# Patient Record
Sex: Male | Born: 1955
Health system: Southern US, Community
[De-identification: ages and names within clinical notes are randomized; demographics above are authoritative.]

## PROBLEM LIST (undated history)

## (undated) DIAGNOSIS — C801 Malignant (primary) neoplasm, unspecified: Secondary | ICD-10-CM

## (undated) DIAGNOSIS — I219 Acute myocardial infarction, unspecified: Secondary | ICD-10-CM

## (undated) DIAGNOSIS — M720 Palmar fascial fibromatosis [Dupuytren]: Secondary | ICD-10-CM

## (undated) DIAGNOSIS — E7800 Pure hypercholesterolemia, unspecified: Secondary | ICD-10-CM

## (undated) DIAGNOSIS — Z9889 Other specified postprocedural states: Secondary | ICD-10-CM

## (undated) DIAGNOSIS — I454 Nonspecific intraventricular block: Secondary | ICD-10-CM

## (undated) DIAGNOSIS — N182 Chronic kidney disease, stage 2 (mild): Secondary | ICD-10-CM

## (undated) DIAGNOSIS — I10 Essential (primary) hypertension: Secondary | ICD-10-CM

## (undated) DIAGNOSIS — I2510 Atherosclerotic heart disease of native coronary artery without angina pectoris: Secondary | ICD-10-CM

## (undated) DIAGNOSIS — Z87442 Personal history of urinary calculi: Secondary | ICD-10-CM

## (undated) DIAGNOSIS — F32A Depression, unspecified: Secondary | ICD-10-CM

## (undated) DIAGNOSIS — S62009A Unspecified fracture of navicular [scaphoid] bone of unspecified wrist, initial encounter for closed fracture: Secondary | ICD-10-CM

## (undated) DIAGNOSIS — S2239XA Fracture of one rib, unspecified side, initial encounter for closed fracture: Secondary | ICD-10-CM

## (undated) DIAGNOSIS — R0789 Other chest pain: Secondary | ICD-10-CM

## (undated) DIAGNOSIS — Z8619 Personal history of other infectious and parasitic diseases: Secondary | ICD-10-CM

## (undated) DIAGNOSIS — F329 Major depressive disorder, single episode, unspecified: Secondary | ICD-10-CM

## (undated) DIAGNOSIS — H5350 Unspecified color vision deficiencies: Secondary | ICD-10-CM

## (undated) DIAGNOSIS — F319 Bipolar disorder, unspecified: Secondary | ICD-10-CM

## (undated) HISTORY — DX: Depression, unspecified: F32.A

## (undated) HISTORY — DX: Fracture of one rib, unspecified side, initial encounter for closed fracture: S22.39XA

## (undated) HISTORY — DX: Personal history of urinary calculi: Z87.442

## (undated) HISTORY — PX: KNEE ARTHROSCOPY: SHX127

## (undated) HISTORY — DX: Palmar fascial fibromatosis (dupuytren): M72.0

## (undated) HISTORY — PX: LUMBAR SPINE SURGERY: SHX701

## (undated) HISTORY — DX: Unspecified fracture of navicular (scaphoid) bone of unspecified wrist, initial encounter for closed fracture: S62.009A

## (undated) HISTORY — DX: Personal history of other infectious and parasitic diseases: Z86.19

## (undated) HISTORY — PX: HAND SURGERY: SHX662

## (undated) HISTORY — DX: Unspecified color vision deficiencies: H53.50

## (undated) HISTORY — DX: Bipolar disorder, unspecified: F31.9

## (undated) HISTORY — DX: Pure hypercholesterolemia, unspecified: E78.00

## (undated) HISTORY — DX: Major depressive disorder, single episode, unspecified: F32.9

---

## 1999-03-21 ENCOUNTER — Ambulatory Visit (HOSPITAL_COMMUNITY): Admission: RE | Admit: 1999-03-21 | Discharge: 1999-03-21 | Payer: Self-pay | Admitting: Neurosurgery

## 1999-03-21 ENCOUNTER — Encounter: Payer: Self-pay | Admitting: Neurosurgery

## 1999-11-17 ENCOUNTER — Encounter: Payer: Self-pay | Admitting: Neurosurgery

## 1999-11-17 ENCOUNTER — Ambulatory Visit (HOSPITAL_COMMUNITY): Admission: RE | Admit: 1999-11-17 | Discharge: 1999-11-17 | Payer: Self-pay | Admitting: Neurosurgery

## 2001-11-18 ENCOUNTER — Encounter: Payer: Self-pay | Admitting: Family Medicine

## 2001-11-18 ENCOUNTER — Ambulatory Visit (HOSPITAL_COMMUNITY): Admission: RE | Admit: 2001-11-18 | Discharge: 2001-11-18 | Payer: Self-pay | Admitting: Family Medicine

## 2001-11-28 ENCOUNTER — Observation Stay (HOSPITAL_COMMUNITY): Admission: RE | Admit: 2001-11-28 | Discharge: 2001-11-29 | Payer: Self-pay | Admitting: Neurosurgery

## 2001-11-28 ENCOUNTER — Encounter: Payer: Self-pay | Admitting: Neurosurgery

## 2002-02-08 ENCOUNTER — Emergency Department (HOSPITAL_COMMUNITY): Admission: EM | Admit: 2002-02-08 | Discharge: 2002-02-08 | Payer: Self-pay | Admitting: Emergency Medicine

## 2002-02-08 ENCOUNTER — Encounter: Payer: Self-pay | Admitting: Emergency Medicine

## 2009-08-16 ENCOUNTER — Encounter: Payer: Self-pay | Admitting: Family Medicine

## 2009-08-16 LAB — CONVERTED CEMR LAB
BUN: 6 mg/dL
Cholesterol: 234 mg/dL
Creatinine, Ser: 1.1 mg/dL
Direct LDL: 165 mg/dL
Glucose, Bld: 73 mg/dL
HDL: 38 mg/dL
PSA: 0.75 ng/mL
Total CHOL/HDL Ratio: 6.16
Triglycerides: 163 mg/dL

## 2010-07-25 ENCOUNTER — Encounter: Payer: Self-pay | Admitting: Family Medicine

## 2010-07-25 DIAGNOSIS — Z87442 Personal history of urinary calculi: Secondary | ICD-10-CM | POA: Insufficient documentation

## 2010-07-25 DIAGNOSIS — Z8659 Personal history of other mental and behavioral disorders: Secondary | ICD-10-CM | POA: Insufficient documentation

## 2010-07-25 DIAGNOSIS — E7800 Pure hypercholesterolemia, unspecified: Secondary | ICD-10-CM | POA: Insufficient documentation

## 2010-07-27 ENCOUNTER — Ambulatory Visit (INDEPENDENT_AMBULATORY_CARE_PROVIDER_SITE_OTHER): Payer: BC Managed Care – PPO | Admitting: Family Medicine

## 2010-07-27 ENCOUNTER — Other Ambulatory Visit: Payer: Self-pay | Admitting: Family Medicine

## 2010-07-27 ENCOUNTER — Encounter: Payer: Self-pay | Admitting: Family Medicine

## 2010-07-27 DIAGNOSIS — E7800 Pure hypercholesterolemia, unspecified: Secondary | ICD-10-CM

## 2010-07-27 DIAGNOSIS — E785 Hyperlipidemia, unspecified: Secondary | ICD-10-CM

## 2010-07-27 DIAGNOSIS — F329 Major depressive disorder, single episode, unspecified: Secondary | ICD-10-CM | POA: Insufficient documentation

## 2010-07-27 DIAGNOSIS — M720 Palmar fascial fibromatosis [Dupuytren]: Secondary | ICD-10-CM | POA: Insufficient documentation

## 2010-07-27 DIAGNOSIS — Z0000 Encounter for general adult medical examination without abnormal findings: Secondary | ICD-10-CM

## 2010-07-27 DIAGNOSIS — Z9189 Other specified personal risk factors, not elsewhere classified: Secondary | ICD-10-CM | POA: Insufficient documentation

## 2010-07-27 DIAGNOSIS — Z125 Encounter for screening for malignant neoplasm of prostate: Secondary | ICD-10-CM

## 2010-07-27 LAB — LIPID PANEL
Cholesterol: 272 mg/dL — ABNORMAL HIGH (ref 0–200)
HDL: 37 mg/dL — ABNORMAL LOW (ref >39.00)
Total CHOL/HDL Ratio: 7
Triglycerides: 202 mg/dL — ABNORMAL HIGH (ref 0.0–149.0)
VLDL: 40.4 mg/dL — ABNORMAL HIGH (ref 0.0–40.0)

## 2010-07-27 LAB — HEPATIC FUNCTION PANEL
ALT: 18 U/L (ref 0–53)
AST: 21 U/L (ref 0–37)
Albumin: 4.1 g/dL (ref 3.5–5.2)
Alkaline Phosphatase: 73 U/L (ref 39–117)
Bilirubin, Direct: 0.1 mg/dL (ref 0.0–0.3)
Total Bilirubin: 0.5 mg/dL (ref 0.3–1.2)
Total Protein: 7.1 g/dL (ref 6.0–8.3)

## 2010-07-27 LAB — BASIC METABOLIC PANEL
BUN: 11 mg/dL (ref 6–23)
CO2: 29 mEq/L (ref 19–32)
Calcium: 9.8 mg/dL (ref 8.4–10.5)
Chloride: 104 mEq/L (ref 96–112)
Creatinine, Ser: 1.2 mg/dL (ref 0.4–1.5)
GFR: 64.57 mL/min (ref >60.00)
Glucose, Bld: 85 mg/dL (ref 70–99)
Potassium: 4.9 mEq/L (ref 3.5–5.1)
Sodium: 141 mEq/L (ref 135–145)

## 2010-07-27 LAB — PSA: PSA: 0.63 ng/mL (ref 0.10–4.00)

## 2010-07-27 LAB — LDL CHOLESTEROL, DIRECT: Direct LDL: 187.1 mg/dL

## 2010-07-28 ENCOUNTER — Encounter (INDEPENDENT_AMBULATORY_CARE_PROVIDER_SITE_OTHER): Payer: Self-pay | Admitting: *Deleted

## 2010-08-02 NOTE — Letter (Signed)
Summary: [facility]: Immunoassay Fecal Occult Blood (iFOB) Order Form  [place] at [facility]  [address] [place], [place] [address]   Phone: [phone]  Fax: [phone]      [facility]: Immunoassay Fecal Occult Blood (iFOB) Order Form   July 27, 2010 MRN: [id number]   THAISON KOLODZIEJSKI 06-11-1956   Physicican Name:________duncan_________________  Diagnosis Code:_________v76.49_________________      Crawford Givens MD

## 2010-08-02 NOTE — Letter (Signed)
Summary: Joseph Vaughn at [facility]   Imported By: Maryln Gottron 07/28/2010 14:31:12  _____________________________________________________________________  External Attachment:    Type:   Image     Comment:   External Document

## 2010-08-02 NOTE — Letter (Signed)
Summary: Generic Letter  [place] at [facility]  [address] [place], [place] [address]   Phone: [phone]  Fax: [phone]    07/28/2010    [facility] [address] [place], [place]  [address]  [place]     Dear Joseph Vaughn,   Your PSA is fine, sugar and Liver function tests are fine.  Lipids are up.  I recommend  increasing your exercise, work on low fat diet and recheck in the summer.  Your appointments are enclosed with this mailing.  Please call the office if these appointments are not convenient.  If you have to change your lab appointment, please change your office visit appointment accordingly so that lab results will be resulted to [place] at your visit.  Thank you.       Sincerely,   Joseph Vaughn CMA (AAMA)

## 2010-08-02 NOTE — Assessment & Plan Note (Signed)
Summary: new pt to est cpe [facility]   Vital Signs:  Patient profile:   55 year old male Height:      65 inches Weight:      160.75 pounds BMI:     26.85 Temp:     98.2 degrees F oral Pulse rate:   80 / minute Pulse rhythm:   regular BP sitting:   112 / 72  (left arm) Cuff size:   regular  Vitals Entered By: Delilah Shan CMA Joseph Vaughn) (July 27, 2010 9:53 AM) CC: New Patient to Establish - CPE   History of Present Illness: CPE- fasting.  due for labs.  See plan.   Prev [facility] patient.   BAD.  Seeing Dr. Toni Arthurs with psych.  Mood is much improved on wellbutrin.  Prev with SAD in the winter but much improved this year.    Has follow up with ophtho pending- going on Monday.    Elevated Cholesterol: no meds prev with elevation of lipids due for labs  d/w patient: re exercise, currently minimal but he increases during the summer.   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  Otherwise negative.    Physical Exam  General:  GEN: nad, alert and oriented HEENT: mucous membranes moist NECK: supple w/o LA CV: rrr.  no murmur PULM: ctab, no inc wob ABD: soft, +bs EXT: no edema SKIN: no acute rash  dupuytren's contracture, right 5th finger noted.  Rectal:  No external abnormalities noted. Normal sphincter tone. No rectal masses or tenderness. Prostate:  Prostate gland firm and smooth, no enlargement, nodularity, tenderness, mass, asymmetry or induration.   Impression & Recommendations:  Problem # 1:  Preventive Health Care (ICD-V70.0) d/w patient ZO:XWRU and exercise.  Healthy habits encouraged.  Flu shot encouraged.  d/w patient EA:VWUJW CA screening.  No FH and no blood in stool per patient.  It is reasonable to check IFOB and if pos then proceed with colonoscopy.  Colonoscopy offered and he opted for IFOB.    Problem # 2:  PURE HYPERCHOLESTEROLEMIA (ICD-272.0) check labs and contact patient.  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  Nonbothersome.  r5th finger.   Problem # 4:  Screening PSA (ICD-V76.44) will check given the FH.   DRE wnl.  Complete Medication List: 1)  Lamictal 150 Mg Tabs (Lamotrigine) .... Take 1 tablet by mouth once a day 2)  Wellbutrin Sr 150 Mg Xr12h-tab (Bupropion hcl) .... Take 1 tablet by mouth once a day  Other Orders: TLB-PSA (Prostate Specific Antigen) (84153-PSA)  Patient Instructions: 1)  I would get more exercise. 2)  I would think about getting a flu shot.   3)  You can get your results through our phone system.   Follow the instructions on the blue card.  4)  Glad to see you today.   5)  Take care.    Orders Added: 1)  Est. Patient 40-64 years [99396] 2)  Est. Patient Level III [69629] 3)  TLB-BMP (Basic Metabolic Panel-BMET) [80048-METABOL] 4)  TLB-Hepatic/Liver Function Pnl [80076-HEPATIC] 5)  TLB-Lipid Panel [80061-LIPID] 6)  TLB-PSA (Prostate Specific Antigen) [52841-LKG]    Current Allergies (reviewed today): No known allergies

## 2010-09-08 ENCOUNTER — Other Ambulatory Visit: Payer: BC Managed Care – PPO

## 2010-09-08 DIAGNOSIS — Z1289 Encounter for screening for malignant neoplasm of other sites: Secondary | ICD-10-CM

## 2010-12-22 ENCOUNTER — Other Ambulatory Visit: Payer: Self-pay | Admitting: Family Medicine

## 2010-12-22 DIAGNOSIS — E7800 Pure hypercholesterolemia, unspecified: Secondary | ICD-10-CM

## 2010-12-26 ENCOUNTER — Other Ambulatory Visit (INDEPENDENT_AMBULATORY_CARE_PROVIDER_SITE_OTHER): Payer: BC Managed Care – PPO | Admitting: Family Medicine

## 2010-12-26 ENCOUNTER — Ambulatory Visit: Payer: BC Managed Care – PPO | Admitting: Family Medicine

## 2010-12-26 DIAGNOSIS — E7800 Pure hypercholesterolemia, unspecified: Secondary | ICD-10-CM

## 2010-12-26 LAB — LDL CHOLESTEROL, DIRECT: Direct LDL: 177.4 mg/dL

## 2010-12-26 LAB — LIPID PANEL
Cholesterol: 245 mg/dL — ABNORMAL HIGH (ref 0–200)
HDL: 35.6 mg/dL — ABNORMAL LOW (ref >39.00)
Total CHOL/HDL Ratio: 7
Triglycerides: 197 mg/dL — ABNORMAL HIGH (ref 0.0–149.0)
VLDL: 39.4 mg/dL (ref 0.0–40.0)

## 2011-01-02 ENCOUNTER — Ambulatory Visit: Payer: BC Managed Care – PPO | Admitting: Family Medicine

## 2011-01-02 ENCOUNTER — Encounter: Payer: Self-pay | Admitting: Family Medicine

## 2011-01-03 ENCOUNTER — Ambulatory Visit (INDEPENDENT_AMBULATORY_CARE_PROVIDER_SITE_OTHER): Payer: BC Managed Care – PPO | Admitting: Family Medicine

## 2011-01-03 ENCOUNTER — Ambulatory Visit: Payer: BC Managed Care – PPO | Admitting: Family Medicine

## 2011-01-03 ENCOUNTER — Encounter: Payer: Self-pay | Admitting: Family Medicine

## 2011-01-03 DIAGNOSIS — E7800 Pure hypercholesterolemia, unspecified: Secondary | ICD-10-CM

## 2011-01-03 DIAGNOSIS — Z125 Encounter for screening for malignant neoplasm of prostate: Secondary | ICD-10-CM

## 2011-01-03 DIAGNOSIS — R2230 Localized swelling, mass and lump, unspecified upper limb: Secondary | ICD-10-CM

## 2011-01-03 DIAGNOSIS — H029 Unspecified disorder of eyelid: Secondary | ICD-10-CM

## 2011-01-03 DIAGNOSIS — L570 Actinic keratosis: Secondary | ICD-10-CM

## 2011-01-03 DIAGNOSIS — R229 Localized swelling, mass and lump, unspecified: Secondary | ICD-10-CM

## 2011-01-03 NOTE — Assessment & Plan Note (Signed)
He has some diffuse changes and I'd like derm input on treatment, instead of spot treatment.  Refer.

## 2011-01-03 NOTE — Progress Notes (Signed)
Elevated Cholesterol: No meds.  Labs reviewed with patient.  He made some progress on lipids.  We talked about diet and exercise.  Excess upper eyelid tissue- he asked about options.  He'll call about f/u with eye clinic.   Skin rash- noted recently.  See exam.   Noted change in L axilla in last 24h.   Meds, vitals, and allergies reviewed.   ROS: See HPI.  Otherwise negative.    GEN: nad, alert and oriented HEENT: mucous membranes moist, excess upper eyelid tissue noted NECK: supple w/o LA CV: rrr. PULM: ctab, no inc wob ABD: soft, +bs EXT: no edema SKIN: appears to have diffuse actinic changes on the skin on L>R forearms.  Also with fleshy papule on R cheek of face L axilla with <1cm skin lesion, not ttp and no overlying skin changes.

## 2011-01-03 NOTE — Assessment & Plan Note (Signed)
D/w pt about diet, weight.  Recheck at CPE.  No meds now.

## 2011-01-03 NOTE — Assessment & Plan Note (Signed)
This is small, of recent onset- ~24h, no overlying skin changes. No other LA.  Pt to observe and notify me if it persists or enlarges. He agrees.

## 2011-01-03 NOTE — Assessment & Plan Note (Signed)
Bilateral, pt to self refer.

## 2011-01-03 NOTE — Patient Instructions (Signed)
See Shirlee Limerick about your referral before your leave today. Recheck labs in late 2/13 at fasting lab visit.   CPE a few days after that. Try to work on cutting out cigarettes.  Take care.  Glad to see you.  Call about the eye clinic- let me know if you need help getting the appointment.

## 2011-02-05 ENCOUNTER — Encounter: Payer: Self-pay | Admitting: Family Medicine

## 2011-02-21 ENCOUNTER — Encounter: Payer: Self-pay | Admitting: Family Medicine

## 2011-02-21 DIAGNOSIS — N200 Calculus of kidney: Secondary | ICD-10-CM | POA: Insufficient documentation

## 2011-02-21 HISTORY — DX: Calculus of kidney: N20.0

## 2011-10-18 ENCOUNTER — Telehealth: Payer: Self-pay

## 2011-10-18 MED ORDER — TRAMADOL HCL 50 MG PO TABS: 50.0000 mg | ORAL_TABLET | Freq: Two times a day (BID) | ORAL | Status: AC | PRN

## 2011-10-18 NOTE — Telephone Encounter (Signed)
He can take tramadol bid prn with sedation caution.  If not improved with that and heat, then he needs eval early next week.  Sedation caution on meds.

## 2011-10-18 NOTE — Telephone Encounter (Signed)
Patient advised.

## 2011-10-18 NOTE — Telephone Encounter (Signed)
Last week while sleeping pt thinks pulled muscle at left shoulder;pt does not want appt. Heat helps pain temporarily but pt wants med for pain. Pt said lt shoulder hurts all the time but worse upon movement . 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 Pt last seen 01/03/11 and has cpx scheduled for 12/25/11. Pt uses [facility] [phone] if pharmacy needed/ Pt can be reached at [phone].

## 2011-10-23 ENCOUNTER — Telehealth: Payer: Self-pay | Admitting: Family Medicine

## 2011-10-23 NOTE — Telephone Encounter (Signed)
Caller: Mike/Patient; PCP: Crawford Givens Clelia Croft); CB#: [phone];  Call regarding Neck Pain, onset 05/10, had Tramadol called in by office.  Is calling today due to "the Tramadol is not taking care of the pain with activity."  Is planning an 800 mile motorcycle ride the weekend of 05/18, and would like a stronger med to be called in, or advise if he can take an additional Tylenol or Advil along with it, or even Flexeril (he has Flexeril at home."  RN advised appt for evaluation, he plans to get it checked next week.  Requests to send message to office. PLEASE CALL MIKE AT [phone] AND ADVISE ABOUT THESE ADDITIONAL MEDS, IF RX IS CALLED IN, USES [facility] [address].  THANKS.

## 2011-10-24 NOTE — Telephone Encounter (Signed)
See prev note.  If not improving, then he needs OV and eval.  Thanks.

## 2011-10-24 NOTE — Telephone Encounter (Signed)
Spoke to patient and was advised that he is feeling much better today. Patient stated that if he does not continue to improve he will call for an appointment.

## 2011-12-11 ENCOUNTER — Ambulatory Visit (INDEPENDENT_AMBULATORY_CARE_PROVIDER_SITE_OTHER): Payer: BC Managed Care – PPO | Admitting: Family Medicine

## 2011-12-11 ENCOUNTER — Encounter: Payer: Self-pay | Admitting: Family Medicine

## 2011-12-11 ENCOUNTER — Other Ambulatory Visit: Payer: BC Managed Care – PPO

## 2011-12-11 ENCOUNTER — Ambulatory Visit: Payer: BC Managed Care – PPO | Admitting: Family Medicine

## 2011-12-11 VITALS — BP 120/76 | HR 77 | Temp 98.2°F | Wt 160.0 lb

## 2011-12-11 DIAGNOSIS — M542 Cervicalgia: Secondary | ICD-10-CM | POA: Insufficient documentation

## 2011-12-11 MED ORDER — PREDNISONE 10 MG PO TABS: ORAL_TABLET | ORAL | Status: DC

## 2011-12-11 NOTE — Patient Instructions (Addendum)
Take the prednisone with food and see if that helps.  If not, let me know.  Take care.

## 2011-12-11 NOTE — Progress Notes (Signed)
Neck and shoulder pain. Currently has a 'twinge'.  When "full blown" it radiates down to L shoulder and the L lateral upper arm. He can get hand/forearm tingling.  Flexeril didn't help.  This AM he felt well. Pain started some this AM at work. Pain worst on motorcycle.  Legs and arms are extended widely on his motorcycle.  Sx going on intermittently for 2 months.  Initially it was constant, now intermittent over the last few weeks.   No R sided sx.  No new/related leg sx.   He went to chiropractor and didn't get relief.   Not taking aleve or ibuprofen recently.  Had used occ leftover hydrocodone w/o relief.    Meds, vitals, and allergies reviewed.   ROS: See HPI.  Otherwise, noncontributory.  nad ncat Neck supple but some pain with lateral tilt B (though no midline pain) rrr ctab L shoulder with normal ROM but pain on int>ext rotation, +scap assist.  No arm drop. Distally nv intact. DTRs wnl

## 2011-12-11 NOTE — Assessment & Plan Note (Signed)
Likely radicular component and cuff sx.  Would use prednisone taper for now and call back if not improved, may need PT vs ortho eval.  No indication to image today.  D/w pt.  GI caution on steroids.

## 2011-12-19 ENCOUNTER — Other Ambulatory Visit: Payer: BC Managed Care – PPO

## 2011-12-19 ENCOUNTER — Other Ambulatory Visit (INDEPENDENT_AMBULATORY_CARE_PROVIDER_SITE_OTHER): Payer: BC Managed Care – PPO

## 2011-12-19 DIAGNOSIS — E7800 Pure hypercholesterolemia, unspecified: Secondary | ICD-10-CM

## 2011-12-19 DIAGNOSIS — Z125 Encounter for screening for malignant neoplasm of prostate: Secondary | ICD-10-CM

## 2011-12-19 LAB — PSA: PSA: 0.75 ng/mL (ref 0.10–4.00)

## 2011-12-19 LAB — COMPREHENSIVE METABOLIC PANEL
ALT: 25 U/L (ref 0–53)
AST: 20 U/L (ref 0–37)
Albumin: 3.5 g/dL (ref 3.5–5.2)
Alkaline Phosphatase: 70 U/L (ref 39–117)
BUN: 14 mg/dL (ref 6–23)
CO2: 28 mEq/L (ref 19–32)
Calcium: 8.9 mg/dL (ref 8.4–10.5)
Chloride: 101 mEq/L (ref 96–112)
Creatinine, Ser: 1.2 mg/dL (ref 0.4–1.5)
GFR: 64.84 mL/min (ref >60.00)
Glucose, Bld: 86 mg/dL (ref 70–99)
Potassium: 4.3 mEq/L (ref 3.5–5.1)
Sodium: 138 mEq/L (ref 135–145)
Total Bilirubin: 0.6 mg/dL (ref 0.3–1.2)
Total Protein: 6.3 g/dL (ref 6.0–8.3)

## 2011-12-19 LAB — LIPID PANEL
Cholesterol: 262 mg/dL — ABNORMAL HIGH (ref 0–200)
HDL: 39.7 mg/dL (ref >39.00)
Total CHOL/HDL Ratio: 7
Triglycerides: 218 mg/dL — ABNORMAL HIGH (ref 0.0–149.0)
VLDL: 43.6 mg/dL — ABNORMAL HIGH (ref 0.0–40.0)

## 2011-12-19 LAB — LDL CHOLESTEROL, DIRECT: Direct LDL: 161.7 mg/dL

## 2011-12-25 ENCOUNTER — Encounter: Payer: BC Managed Care – PPO | Admitting: Family Medicine

## 2011-12-27 ENCOUNTER — Encounter: Payer: Self-pay | Admitting: Family Medicine

## 2011-12-27 ENCOUNTER — Ambulatory Visit (INDEPENDENT_AMBULATORY_CARE_PROVIDER_SITE_OTHER): Payer: BC Managed Care – PPO | Admitting: Family Medicine

## 2011-12-27 VITALS — BP 150/90 | HR 82 | Temp 98.4°F | Ht 65.0 in | Wt 161.0 lb

## 2011-12-27 DIAGNOSIS — Z8659 Personal history of other mental and behavioral disorders: Secondary | ICD-10-CM

## 2011-12-27 DIAGNOSIS — M720 Palmar fascial fibromatosis [Dupuytren]: Secondary | ICD-10-CM

## 2011-12-27 DIAGNOSIS — Z1211 Encounter for screening for malignant neoplasm of colon: Secondary | ICD-10-CM

## 2011-12-27 DIAGNOSIS — Z0000 Encounter for general adult medical examination without abnormal findings: Secondary | ICD-10-CM

## 2011-12-27 DIAGNOSIS — M25519 Pain in unspecified shoulder: Secondary | ICD-10-CM

## 2011-12-27 DIAGNOSIS — E7800 Pure hypercholesterolemia, unspecified: Secondary | ICD-10-CM

## 2011-12-27 NOTE — Patient Instructions (Addendum)
Go to the lab on the way out.  We'll contact you with your lab report. See Shirlee Limerick about your referral before you leave today. Take care.  I would get a flu shot each fall.   We'll address your cholesterol after the shoulder/neck symptom are resolved and your hand is treated.  Call back about the hand center referral when desired.  Glad to see you.

## 2011-12-27 NOTE — Progress Notes (Signed)
CPE- See plan.  Routine anticipatory guidance given to patient.  See health maintenance. Tetanus 2009 Labs d/w pt. Smoking- continued.  Precontemplative.  He'll notify me when ready to quit.  PSA wnl.  FH prostate cancer.   Colon cancer screening.  D/w patient ZO:[id number] for colon cancer screening, including IFOB vs. colonoscopy.  Risks and benefits of both were discussed and patient voiced understanding.  Pt elects for: IFOB.  Flu shot.  Encouraged.   BP prev controlled.  Elevation today is isolated.    BAD, continued on meds and he's considering a second opinion.  Mood controlled and stable.    R hand contracture, this was tabled due to neck pain.  L neck and arm pain continues.  No pain with overhead rom prev.  Was thought to haverRadicular component in L arm. Positional.  Minimal effect with steroids prev in 7/13.  He did have cuff finding prev.   PMH and SH reviewed  Meds, vitals, and allergies reviewed.   ROS: See HPI.  Otherwise negative.    GEN: nad, alert and oriented HEENT: mucous membranes moist NECK: supple w/o LA CV: rrr. PULM: ctab, no inc wob ABD: soft, +bs EXT: no edema SKIN: no acute rash DRE deferred.   L shoulder-Pain with int rotation.  No arm drop. Distally nv intact.  Spurling neg.

## 2011-12-28 DIAGNOSIS — Z0000 Encounter for general adult medical examination without abnormal findings: Secondary | ICD-10-CM | POA: Insufficient documentation

## 2011-12-28 DIAGNOSIS — M25519 Pain in unspecified shoulder: Secondary | ICD-10-CM | POA: Insufficient documentation

## 2011-12-28 NOTE — Assessment & Plan Note (Signed)
Routine anticipatory guidance given to patient.  See health maintenance. Tetanus 2009 Labs d/w pt. Smoking- continued.  Precontemplative.  He'll notify me when ready to quit.  PSA wnl.  FH prostate cancer.   Colon cancer screening.  D/w patient [id number] for colon cancer screening, including IFOB vs. colonoscopy.  Risks and benefits of both were discussed and patient voiced understanding.  Pt elects for: IFOB.  Flu shot.  Encouraged.   BP prev controlled.  Elevation today is isolated.

## 2011-12-28 NOTE — Assessment & Plan Note (Signed)
Per psych 

## 2011-12-28 NOTE — Assessment & Plan Note (Signed)
This was tabled in light of neck/shoulder sx.  Continue diet/exercise.

## 2011-12-28 NOTE — Assessment & Plan Note (Signed)
Can be addressed after should/neck sx are addressed.

## 2011-12-28 NOTE — Assessment & Plan Note (Signed)
Unclear if he has shoulder source causing neck pain or if he has 2 separate issues.  I would like ortho input on his shoulder and then proceed from there.  D/w pt.  He agrees.

## 2012-01-06 ENCOUNTER — Encounter: Payer: Self-pay | Admitting: Family Medicine

## 2014-04-27 ENCOUNTER — Ambulatory Visit (INDEPENDENT_AMBULATORY_CARE_PROVIDER_SITE_OTHER): Payer: BC Managed Care – PPO | Admitting: Family Medicine

## 2014-04-27 ENCOUNTER — Encounter: Payer: Self-pay | Admitting: Family Medicine

## 2014-04-27 VITALS — BP 124/78 | HR 64 | Temp 98.3°F | Wt 166.5 lb

## 2014-04-27 DIAGNOSIS — K644 Residual hemorrhoidal skin tags: Secondary | ICD-10-CM

## 2014-04-27 DIAGNOSIS — K648 Other hemorrhoids: Secondary | ICD-10-CM

## 2014-04-27 MED ORDER — HYDROCORTISONE 2.5 % RE CREA: 1.0000 "application " | TOPICAL_CREAM | Freq: Two times a day (BID) | RECTAL | Status: DC

## 2014-04-27 MED ORDER — TRAMADOL HCL 50 MG PO TABS: 50.0000 mg | ORAL_TABLET | Freq: Three times a day (TID) | ORAL | Status: DC | PRN

## 2014-04-27 NOTE — Progress Notes (Signed)
Pre visit review using our clinic review tool, if applicable. No additional management support is needed unless otherwise documented below in the visit note.  Rectal pain.  Patient thought he had hemorrhoids.  Going on for the last few weeks.  Was doing better until the weekend.  Did yardwork, then more pain in the meantime.  Last BM was this AM.  He can't tell if he has blood in stool, colorblind.   No fevers, no vomiting.   Has tried nupercainal w/o relief.    Meds, vitals, and allergies reviewed.   ROS: See HPI.  Otherwise, noncontributory.  nad but uncomfortable Ext rectal exam with small tender but nonthrombosed external hemorrhoid

## 2014-04-27 NOTE — Patient Instructions (Signed)
Take a stool softener (colace100mg  a day).  Take tramadol for pain.   Use anusol and keep trying nupercainal.   Avoid straining.  Sitz baths may help.  Take care.

## 2014-04-28 ENCOUNTER — Telehealth: Payer: Self-pay | Admitting: Family Medicine

## 2014-04-28 DIAGNOSIS — K644 Residual hemorrhoidal skin tags: Secondary | ICD-10-CM | POA: Insufficient documentation

## 2014-04-28 NOTE — Telephone Encounter (Signed)
emmi emailed °

## 2014-04-28 NOTE — Assessment & Plan Note (Signed)
Not a candidate for thrombectomy.  Would use tramadol prn pain, stool softener, topical med- see AVS.  Path/phys d/w pt.  F/u prn. He agrees.

## 2014-06-11 HISTORY — PX: CERVICAL SPINE SURGERY: SHX589

## 2014-12-16 ENCOUNTER — Encounter: Payer: Self-pay | Admitting: Family Medicine

## 2015-12-06 ENCOUNTER — Ambulatory Visit (INDEPENDENT_AMBULATORY_CARE_PROVIDER_SITE_OTHER): Payer: BLUE CROSS/BLUE SHIELD | Admitting: Family Medicine

## 2015-12-06 ENCOUNTER — Encounter: Payer: Self-pay | Admitting: Family Medicine

## 2015-12-06 VITALS — BP 126/76 | HR 66 | Temp 98.4°F | Wt 161.0 lb

## 2015-12-06 DIAGNOSIS — R51 Headache: Secondary | ICD-10-CM

## 2015-12-06 DIAGNOSIS — R519 Headache, unspecified: Secondary | ICD-10-CM

## 2015-12-06 LAB — CBC WITH DIFFERENTIAL/PLATELET
Basophils Absolute: 0.1 10*3/uL (ref 0.0–0.1)
Basophils Relative: 0.9 % (ref 0.0–3.0)
Eosinophils Absolute: 0.3 10*3/uL (ref 0.0–0.7)
Eosinophils Relative: 3 % (ref 0.0–5.0)
HCT: 45.5 % (ref 39.0–52.0)
Hemoglobin: 15.2 g/dL (ref 13.0–17.0)
Lymphocytes Relative: 26.6 % (ref 12.0–46.0)
Lymphs Abs: 2.5 10*3/uL (ref 0.7–4.0)
MCHC: 33.4 g/dL (ref 30.0–36.0)
MCV: 92.4 fl (ref 78.0–100.0)
Monocytes Absolute: 0.9 10*3/uL (ref 0.1–1.0)
Monocytes Relative: 9.5 % (ref 3.0–12.0)
Neutro Abs: 5.6 10*3/uL (ref 1.4–7.7)
Neutrophils Relative %: 60 % (ref 43.0–77.0)
Platelets: 262 10*3/uL (ref 150.0–400.0)
RBC: 4.92 Mil/uL (ref 4.22–5.81)
RDW: 13.3 % (ref 11.5–15.5)
WBC: 9.3 10*3/uL (ref 4.0–10.5)

## 2015-12-06 LAB — COMPREHENSIVE METABOLIC PANEL
ALT: 18 U/L (ref 0–53)
AST: 21 U/L (ref 0–37)
Albumin: 4.2 g/dL (ref 3.5–5.2)
Alkaline Phosphatase: 76 U/L (ref 39–117)
BUN: 9 mg/dL (ref 6–23)
CO2: 33 mEq/L — ABNORMAL HIGH (ref 19–32)
Calcium: 9.8 mg/dL (ref 8.4–10.5)
Chloride: 101 mEq/L (ref 96–112)
Creatinine, Ser: 1.27 mg/dL (ref 0.40–1.50)
GFR: 61.62 mL/min (ref >60.00)
Glucose, Bld: 94 mg/dL (ref 70–99)
Potassium: 4.7 mEq/L (ref 3.5–5.1)
Sodium: 137 mEq/L (ref 135–145)
Total Bilirubin: 0.5 mg/dL (ref 0.2–1.2)
Total Protein: 7.2 g/dL (ref 6.0–8.3)

## 2015-12-06 LAB — SEDIMENTATION RATE: Sed Rate: 5 mm/hr (ref 0–20)

## 2015-12-06 MED ORDER — CYCLOBENZAPRINE HCL 5 MG PO TABS: 5.0000 mg | ORAL_TABLET | Freq: Three times a day (TID) | ORAL | Status: DC | PRN

## 2015-12-06 NOTE — Patient Instructions (Signed)
Try taking flexeril as needed for the headache.  Sedation caution.   Go to the lab on the way out.  We'll contact you with your lab report. I'll check with neurology in the meantime.   Take care.  Glad to see you.

## 2015-12-06 NOTE — Progress Notes (Signed)
Pre visit review using our clinic review tool, if applicable. No additional management support is needed unless otherwise documented below in the visit note.  Headache.  Going on for about 1 month intermittently.  L ear pain.  HA can occ get better.  Resolved yesterday but then came back this AM.  He can get L upper eyelid droop with the HA.  No R sided droop.  No double vision.  No lip droop.  No speech changes. No vision changes o/w.  No arm or leg sx.  1-2 beers causes the HA to cease quickly.  Less caffeine then prev but still a few cokes a day.  HA is throbbing.  No nausea, no vomiting usually, once or twice was slightly nauseated.  No photophobia.    Always on the L side.  Never on R side of face  Prev neck surgery 2016.    H/o migraine in the past but this is still atypical for patient, with the eyelid droop on the L side.   PMH and SH reviewed  ROS: Per HPI unless specifically indicated in ROS section   Meds, vitals, and allergies reviewed.   GEN: nad, alert and oriented HEENT: mucous membranes moist, tm wnl B nasal and OP exam wnl NECK: supple w/o LA CV: rrr PULM: ctab, no inc wob ABD: soft, +bs EXT: no edema SKIN: no acute rash CN 2-12 wnl B, S/S/DTR wnl x4 except for slight L upper eyelid/brow droop (he has HA currently).

## 2015-12-07 ENCOUNTER — Telehealth: Payer: Self-pay | Admitting: Family Medicine

## 2015-12-07 ENCOUNTER — Encounter: Payer: Self-pay | Admitting: Family Medicine

## 2015-12-07 DIAGNOSIS — R519 Headache, unspecified: Secondary | ICD-10-CM | POA: Insufficient documentation

## 2015-12-07 DIAGNOSIS — R51 Headache: Secondary | ICD-10-CM

## 2015-12-07 DIAGNOSIS — G43809 Other migraine, not intractable, without status migrainosus: Secondary | ICD-10-CM

## 2015-12-07 NOTE — Assessment & Plan Note (Addendum)
H/o migraine but this is atypical for him.  Going on for the last month.  Only focal neuro change is L upper eyelid droop with the HA, never present w/o HA.  Not needing emergent sx at this point, given the duration and exam.  I would like to check labs today and d/w neuro in the meantime.  We'll be in touch with patient.  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  .>25 minutes spent in face to face time with patient, >50% spent in counselling or coordination of care.  All d/w pt.  Routine ER cautions d/w pt.    Also d/w pt at the [facility].  He has been having ongoing neck pain, radiating up to his head.  He didn't know if that was related.  He has h/o C spine fusion with Dr. Lynann Bologna.  He doesn't have rash or bruising on the neck.  No focal motor changes in the arms.

## 2015-12-07 NOTE — Telephone Encounter (Signed)
Patient notified as instructed by telephone and verbalized understanding. Advised patient that he will hear back from the referral coordinator to get the MRI set up for him.

## 2015-12-07 NOTE — Telephone Encounter (Signed)
Call pt.  See labs.  Talked to Dr. Tomi Likens.  Would have him see neuro.   Would need MRI with/without prev.   I put in all of the orders.  Thanks.

## 2015-12-15 ENCOUNTER — Telehealth: Payer: Self-pay | Admitting: Family Medicine

## 2015-12-15 DIAGNOSIS — M542 Cervicalgia: Secondary | ICD-10-CM

## 2015-12-15 NOTE — Telephone Encounter (Signed)
Pt called he has mri schedule for 7/11  He wanted to talk to dr Damita Dunnings regarding the [facility]

## 2015-12-16 NOTE — Telephone Encounter (Signed)
Left message with receptionist to ask Dr. Lynann Bologna to return your call.

## 2015-12-16 NOTE — Telephone Encounter (Signed)
Patient advised.  Joseph Vaughn has already scheduled CT.  Patient advised to schedule FU with Dr. Lynann Bologna.

## 2015-12-16 NOTE — Telephone Encounter (Addendum)
Please call Dr. Laurena Bering office (ortho, [phone]).  I either need to talk to him or get a message through. Okay to give my cell number re: call back.  D/w pt.  Likely with migraines, with MRI head pending.   That still needs to be done.  Patient still with sig neck pain and he questions if the neck pain is contributing/triggering the migraines.   Patient questions about concurrently imaging his neck.   Does Dr. Lynann Bologna want this done?  If so, what form of imaging? He is scheduled for MRI brain with and without contrast on Tuesday.  Thanks for help of all.

## 2015-12-16 NOTE — Telephone Encounter (Signed)
Call pt.  I talked with Dr. Lynann Bologna.   Reasonable to check for nerve root impingement (MRI C spine w/o contrast) and malunion from prev surgery (CT C spine w/o contrast). I ordered both.   Then patient patient f/u with Dr. Lynann Bologna.  He'll need to call patient about follow up with Dr. Lynann Bologna.   Thanks.

## 2015-12-20 ENCOUNTER — Ambulatory Visit
Admission: RE | Admit: 2015-12-20 | Discharge: 2015-12-20 | Disposition: A | Discharge status: Home / Self Care | Payer: BLUE CROSS/BLUE SHIELD | Source: Ambulatory Visit | Attending: Family Medicine | Admitting: Family Medicine

## 2015-12-20 ENCOUNTER — Other Ambulatory Visit: Payer: BLUE CROSS/BLUE SHIELD

## 2015-12-20 DIAGNOSIS — G43809 Other migraine, not intractable, without status migrainosus: Secondary | ICD-10-CM

## 2015-12-20 MED ORDER — GADOBENATE DIMEGLUMINE 529 MG/ML IV SOLN
14.0000 mL | Freq: Once | INTRAVENOUS | Status: AC | PRN
  Administered 2015-12-20: 14 mL via INTRAVENOUS

## 2015-12-23 ENCOUNTER — Ambulatory Visit
Admission: RE | Admit: 2015-12-23 | Discharge: 2015-12-23 | Disposition: A | Discharge status: Home / Self Care | Payer: BLUE CROSS/BLUE SHIELD | Source: Ambulatory Visit | Attending: Family Medicine | Admitting: Family Medicine

## 2015-12-23 ENCOUNTER — Other Ambulatory Visit: Payer: BLUE CROSS/BLUE SHIELD

## 2015-12-23 DIAGNOSIS — M542 Cervicalgia: Secondary | ICD-10-CM

## 2016-01-31 ENCOUNTER — Encounter: Payer: Self-pay | Admitting: Family Medicine

## 2016-02-01 ENCOUNTER — Ambulatory Visit (INDEPENDENT_AMBULATORY_CARE_PROVIDER_SITE_OTHER): Payer: BLUE CROSS/BLUE SHIELD | Admitting: Neurology

## 2016-02-01 ENCOUNTER — Encounter: Payer: Self-pay | Admitting: Neurology

## 2016-02-01 VITALS — BP 130/80 | HR 72 | Ht 65.0 in | Wt 167.4 lb

## 2016-02-01 DIAGNOSIS — G4451 Hemicrania continua: Secondary | ICD-10-CM | POA: Insufficient documentation

## 2016-02-01 DIAGNOSIS — H02402 Unspecified ptosis of left eyelid: Secondary | ICD-10-CM
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
## 2016-02-01 NOTE — Progress Notes (Signed)
Opened in error

## 2016-02-01 NOTE — Patient Instructions (Signed)
1.  US carotids  2.  If headaches return, call my office and we can try indomethicin

## 2016-02-08 ENCOUNTER — Other Ambulatory Visit: Payer: Self-pay | Admitting: Neurology

## 2016-02-08 ENCOUNTER — Ambulatory Visit (HOSPITAL_COMMUNITY)
Admission: RE | Admit: 2016-02-08 | Discharge: 2016-02-08 | Disposition: A | Discharge status: Home / Self Care | Payer: BLUE CROSS/BLUE SHIELD | Source: Ambulatory Visit | Attending: Cardiology | Admitting: Cardiology

## 2016-02-08 DIAGNOSIS — G4451 Hemicrania continua: Secondary | ICD-10-CM

## 2016-02-08 DIAGNOSIS — H02402 Unspecified ptosis of left eyelid: Secondary | ICD-10-CM

## 2016-02-08 DIAGNOSIS — R42 Dizziness and giddiness: Secondary | ICD-10-CM | POA: Diagnosis not present

## 2016-02-08 DIAGNOSIS — E785 Hyperlipidemia, unspecified: Secondary | ICD-10-CM | POA: Diagnosis not present

## 2016-02-08 DIAGNOSIS — Z720 Tobacco use: Secondary | ICD-10-CM | POA: Insufficient documentation

## 2016-10-25 ENCOUNTER — Telehealth: Payer: Self-pay | Admitting: *Deleted

## 2016-10-25 NOTE — Telephone Encounter (Signed)
Patient called stating that he had surgery on his neck back 10/2014 and is now having problem with his other shoulder. Patient stated that he has an appointment scheduled with his surgeon Monday. Patient wants to know if you will give him a few pain pills to last him until that appointment?

## 2016-10-26 MED ORDER — CYCLOBENZAPRINE HCL 5 MG PO TABS: 5.0000 mg | ORAL_TABLET | Freq: Three times a day (TID) | ORAL | 0 refills | Status: DC | PRN

## 2016-10-26 NOTE — Telephone Encounter (Signed)
I sent in flexeril in the meantime, will await surgery notes.  Thanks.

## 2016-11-16 ENCOUNTER — Encounter (HOSPITAL_COMMUNITY): Payer: Self-pay

## 2016-11-16 ENCOUNTER — Emergency Department (HOSPITAL_COMMUNITY)
Admission: EM | Admit: 2016-11-16 | Discharge: 2016-11-16 | Disposition: A | Discharge status: Home / Self Care | Payer: Worker's Compensation | Attending: Emergency Medicine | Admitting: Emergency Medicine

## 2016-11-16 ENCOUNTER — Emergency Department (HOSPITAL_COMMUNITY): Payer: Worker's Compensation

## 2016-11-16 DIAGNOSIS — Y939 Activity, unspecified: Secondary | ICD-10-CM | POA: Diagnosis not present

## 2016-11-16 DIAGNOSIS — S63502A Unspecified sprain of left wrist, initial encounter: Secondary | ICD-10-CM | POA: Diagnosis not present

## 2016-11-16 DIAGNOSIS — Y929 Unspecified place or not applicable: Secondary | ICD-10-CM | POA: Insufficient documentation

## 2016-11-16 DIAGNOSIS — W208XXA Other cause of strike by thrown, projected or falling object, initial encounter: Secondary | ICD-10-CM | POA: Diagnosis not present

## 2016-11-16 DIAGNOSIS — F17210 Nicotine dependence, cigarettes, uncomplicated: Secondary | ICD-10-CM | POA: Diagnosis not present

## 2016-11-16 DIAGNOSIS — W19XXXA Unspecified fall, initial encounter: Secondary | ICD-10-CM

## 2016-11-16 DIAGNOSIS — Z79899 Other long term (current) drug therapy: Secondary | ICD-10-CM | POA: Diagnosis not present

## 2016-11-16 DIAGNOSIS — S6992XA Unspecified injury of left wrist, hand and finger(s), initial encounter: Secondary | ICD-10-CM | POA: Diagnosis present

## 2016-11-16 DIAGNOSIS — Y999 Unspecified external cause status: Secondary | ICD-10-CM | POA: Diagnosis not present

## 2016-11-16 DIAGNOSIS — M79642 Pain in left hand: Secondary | ICD-10-CM

## 2016-11-16 NOTE — ED Notes (Signed)
Pt ambulatory and independent at discharge.  Verbalized understanding of discharge instructions 
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
## 2016-11-27 ENCOUNTER — Telehealth: Payer: Self-pay | Admitting: Family Medicine

## 2016-11-27 NOTE — Telephone Encounter (Signed)
Call next week

## 2016-11-27 NOTE — Telephone Encounter (Signed)
Call from anesthesia MD re: patient with LBBB seen on day of surgery.  Will need outpatient f/u.  Please call pt next week (he is in surgery today) for f/u OV here next week for f/u.  Thanks.

## 2016-12-03 DIAGNOSIS — M25532 Pain in left wrist: Secondary | ICD-10-CM | POA: Insufficient documentation

## 2016-12-03 NOTE — Telephone Encounter (Signed)
Left detailed message on voicemail.  

## 2016-12-03 NOTE — Telephone Encounter (Signed)
Patient contacted and says he can't be diagnosed with something like this because he has a [employer] license.  Patient will call for an appointment at a later time.

## 2016-12-04 NOTE — Telephone Encounter (Signed)
Anesthesia has already diagnosed him and he needs f/u.  He is likely way better off getting this addressed.  Thanks.

## 2016-12-04 NOTE — Telephone Encounter (Addendum)
Left detailed message on voicemail.  Patient later scheduled appointment.

## 2016-12-06 ENCOUNTER — Ambulatory Visit (INDEPENDENT_AMBULATORY_CARE_PROVIDER_SITE_OTHER): Payer: BLUE CROSS/BLUE SHIELD | Admitting: Family Medicine

## 2016-12-06 ENCOUNTER — Encounter: Payer: Self-pay | Admitting: Family Medicine

## 2016-12-06 VITALS — BP 130/84 | HR 83 | Temp 97.9°F | Wt 162.5 lb

## 2016-12-06 DIAGNOSIS — I454 Nonspecific intraventricular block: Secondary | ICD-10-CM | POA: Diagnosis not present

## 2016-12-06 LAB — TSH: TSH: 0.67 u[IU]/mL (ref 0.35–4.50)

## 2016-12-06 LAB — COMPREHENSIVE METABOLIC PANEL
ALT: 25 U/L (ref 0–53)
AST: 22 U/L (ref 0–37)
Albumin: 4.3 g/dL (ref 3.5–5.2)
Alkaline Phosphatase: 87 U/L (ref 39–117)
BUN: 10 mg/dL (ref 6–23)
CO2: 30 mEq/L (ref 19–32)
Calcium: 10.1 mg/dL (ref 8.4–10.5)
Chloride: 100 mEq/L (ref 96–112)
Creatinine, Ser: 1.26 mg/dL (ref 0.40–1.50)
GFR: 61.97 mL/min (ref >60.00)
Glucose, Bld: 97 mg/dL (ref 70–99)
Potassium: 4.5 mEq/L (ref 3.5–5.1)
Sodium: 138 mEq/L (ref 135–145)
Total Bilirubin: 0.3 mg/dL (ref 0.2–1.2)
Total Protein: 7.5 g/dL (ref 6.0–8.3)

## 2016-12-06 LAB — CBC WITH DIFFERENTIAL/PLATELET
Basophils Absolute: 0.3 10*3/uL — ABNORMAL HIGH (ref 0.0–0.1)
Basophils Relative: 2.7 % (ref 0.0–3.0)
Eosinophils Absolute: 0.2 10*3/uL (ref 0.0–0.7)
Eosinophils Relative: 1.9 % (ref 0.0–5.0)
HCT: 45.1 % (ref 39.0–52.0)
Hemoglobin: 15.4 g/dL (ref 13.0–17.0)
Lymphocytes Relative: 24.3 % (ref 12.0–46.0)
Lymphs Abs: 2.6 10*3/uL (ref 0.7–4.0)
MCHC: 34.1 g/dL (ref 30.0–36.0)
MCV: 91.3 fl (ref 78.0–100.0)
Monocytes Absolute: 0.9 10*3/uL (ref 0.1–1.0)
Monocytes Relative: 8 % (ref 3.0–12.0)
Neutro Abs: 6.8 10*3/uL (ref 1.4–7.7)
Neutrophils Relative %: 63.1 % (ref 43.0–77.0)
Platelets: 361 10*3/uL (ref 150.0–400.0)
RBC: 4.94 Mil/uL (ref 4.22–5.81)
RDW: 12.6 % (ref 11.5–15.5)
WBC: 10.8 10*3/uL — ABNORMAL HIGH (ref 4.0–10.5)

## 2016-12-06 LAB — LIPID PANEL
Cholesterol: 281 mg/dL — ABNORMAL HIGH (ref 0–200)
HDL: 31.4 mg/dL — ABNORMAL LOW (ref >39.00)
NonHDL: 249.78
Total CHOL/HDL Ratio: 9
Triglycerides: 233 mg/dL — ABNORMAL HIGH (ref 0.0–149.0)
VLDL: 46.6 mg/dL — ABNORMAL HIGH (ref 0.0–40.0)

## 2016-12-06 LAB — LDL CHOLESTEROL, DIRECT: Direct LDL: 190 mg/dL

## 2016-12-06 NOTE — Patient Instructions (Addendum)
Go to the lab on the way out.  We'll contact you with your lab report. Let me check with cardiology in the meantime.  Take care.  Glad to see you.  Update me as needed.

## 2016-12-06 NOTE — Progress Notes (Signed)
L wrist per ortho, I'll defer.  D/w pt.  Splinted.   He has neck brace s/p surgery with f/u pending.  I'll defer, d/w pt.    He had episode in the middle of the night, lasted a few minutes, with severe CP.  This was months ago.  No more sx in the meantime.  No sx like that prior.  He has noted sensation of occ heart fluttering.  No CP o/w.  Not SOB.    Had recent surgery and left bundle branch block noted. He did not know he had this before. EKG from that day reviewed. Repeat EKG done today, discussed with patient, no significant change from previous. Left bundle branch block still present. Chest pain-free currently.  PMH and SH reviewed  ROS: Per HPI unless specifically indicated in ROS section   Meds, vitals, and allergies reviewed.   GEN: nad, alert and oriented HEENT: mucous membranes moist NECK: Neck brace. CV: rrr.  PULM: ctab, no inc wob ABD: soft, +bs EXT: no edema Left wrist splinted.

## 2016-12-07 DIAGNOSIS — I454 Nonspecific intraventricular block: Secondary | ICD-10-CM | POA: Insufficient documentation

## 2016-12-07 NOTE — Assessment & Plan Note (Addendum)
He had an episode months ago where he had severe chest pain that resolved after a few minutes. He did not seek evaluation. He did not think much about it until recently when it was noted that he had an incidental left bundle branch block. Repeat EKG done today with similar findings. Discussed with patient about pathophysiology of her left bundle block. He has no chest pain now. He is already in the neck brace and a left wrist brace. Unclear how much he can tolerate with exercise tolerance test. Check routine labs today. He is chest pain-free. I'll talk to cardiology about his potential workup from this point forward. He agrees with plan. >25 minutes spent in face to face time with patient, >50% spent in counselling or coordination of care.

## 2016-12-10 ENCOUNTER — Other Ambulatory Visit: Payer: Self-pay | Admitting: Family Medicine

## 2016-12-10 DIAGNOSIS — E785 Hyperlipidemia, unspecified: Secondary | ICD-10-CM

## 2016-12-10 MED ORDER — ATORVASTATIN CALCIUM 20 MG PO TABS: 20.0000 mg | ORAL_TABLET | Freq: Every day | ORAL | 3 refills | Status: DC

## 2016-12-11 ENCOUNTER — Telehealth: Payer: Self-pay | Admitting: Family Medicine

## 2016-12-11 DIAGNOSIS — I447 Left bundle-branch block, unspecified: Secondary | ICD-10-CM

## 2016-12-11 NOTE — Telephone Encounter (Signed)
Patient advised.

## 2016-12-11 NOTE — Telephone Encounter (Signed)
Call pt.  I checked on this.  Needs cards eval with the LBBB.  I put in the referral.  Thanks.

## 2016-12-19 DIAGNOSIS — S52502A Unspecified fracture of the lower end of left radius, initial encounter for closed fracture: Secondary | ICD-10-CM | POA: Insufficient documentation
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
## 2017-01-24 ENCOUNTER — Encounter (INDEPENDENT_AMBULATORY_CARE_PROVIDER_SITE_OTHER): Payer: Self-pay

## 2017-01-24 ENCOUNTER — Encounter: Payer: Self-pay | Admitting: Cardiovascular Disease

## 2017-01-24 ENCOUNTER — Ambulatory Visit (INDEPENDENT_AMBULATORY_CARE_PROVIDER_SITE_OTHER): Payer: BLUE CROSS/BLUE SHIELD | Admitting: Cardiovascular Disease

## 2017-01-24 VITALS — BP 130/70 | HR 74 | Ht 65.0 in | Wt 165.1 lb

## 2017-01-24 DIAGNOSIS — I447 Left bundle-branch block, unspecified: Secondary | ICD-10-CM

## 2017-01-24 NOTE — Patient Instructions (Signed)
Medication Instructions:  Your physician recommends that you continue on your current medications as directed. Please refer to the Current Medication list given to you today.   Labwork: NONE ORDERED  Testing/Procedures: 1. Your physician has requested that you have a lexiscan myoview. For further information please visit [email or web address]. Please follow instruction sheet, as given.  2. Your physician has requested that you have an echocardiogram. Echocardiography is a painless test that uses sound waves to create images of your heart. It provides your doctor with information about the size and shape of your heart and how well your heart's chambers and valves are working. This procedure takes approximately one hour. There are no restrictions for this procedure.    Follow-Up: Your physician wants you to follow-up in: [place] Blima Singer will receive a reminder letter in the mail two months in advance. If you don't receive a letter, please call our office to schedule the follow-up appointment.   Any Other Special Instructions Will Be Listed Below (If Applicable).     If you need a refill on your cardiac medications before your next appointment, please call your pharmacy.

## 2017-01-30 ENCOUNTER — Telehealth (HOSPITAL_COMMUNITY): Payer: Self-pay | Admitting: *Deleted

## 2017-01-30 NOTE — Telephone Encounter (Signed)
Patient given detailed instructions per Myocardial Perfusion Study Information Sheet for the test on  01/31/17 Patient notified to arrive 15 minutes early and that it is imperative to arrive on time for appointment to keep from having the test rescheduled.  If you need to cancel or reschedule your appointment, please call the office within 24 hours of your appointment. . Patient verbalized understanding. Kirstie Peri

## 2017-01-31 ENCOUNTER — Ambulatory Visit (HOSPITAL_BASED_OUTPATIENT_CLINIC_OR_DEPARTMENT_OTHER): Payer: BLUE CROSS/BLUE SHIELD

## 2017-01-31 ENCOUNTER — Ambulatory Visit (HOSPITAL_COMMUNITY): Payer: BLUE CROSS/BLUE SHIELD | Attending: Internal Medicine

## 2017-01-31 ENCOUNTER — Other Ambulatory Visit: Payer: Self-pay

## 2017-01-31 DIAGNOSIS — E785 Hyperlipidemia, unspecified: Secondary | ICD-10-CM | POA: Diagnosis not present

## 2017-01-31 DIAGNOSIS — I447 Left bundle-branch block, unspecified: Secondary | ICD-10-CM

## 2017-01-31 DIAGNOSIS — F17210 Nicotine dependence, cigarettes, uncomplicated: Secondary | ICD-10-CM | POA: Insufficient documentation

## 2017-01-31 DIAGNOSIS — I071 Rheumatic tricuspid insufficiency: Secondary | ICD-10-CM | POA: Diagnosis not present

## 2017-01-31 DIAGNOSIS — R079 Chest pain, unspecified: Secondary | ICD-10-CM | POA: Diagnosis not present

## 2017-01-31 DIAGNOSIS — R9439 Abnormal result of other cardiovascular function study: Secondary | ICD-10-CM | POA: Insufficient documentation

## 2017-01-31 LAB — MYOCARDIAL PERFUSION IMAGING
LV dias vol: 85 mL (ref 62–150)
LV sys vol: 39 mL
Peak HR: 90 {beats}/min
RATE: 0.3
Rest HR: 60 {beats}/min
SDS: 1
SRS: 12
SSS: 13
TID: 0.99

## 2017-01-31 MED ORDER — TECHNETIUM TC 99M TETROFOSMIN IV KIT
31.1000 | PACK | Freq: Once | INTRAVENOUS | Status: AC | PRN
  Administered 2017-01-31: 31.1 via INTRAVENOUS
  Filled 2017-01-31: qty 32

## 2017-01-31 MED ORDER — TECHNETIUM TC 99M TETROFOSMIN IV KIT
10.2000 | PACK | Freq: Once | INTRAVENOUS | Status: AC | PRN
  Administered 2017-01-31: 10.2 via INTRAVENOUS
  Filled 2017-01-31: qty 11

## 2017-01-31 MED ORDER — REGADENOSON 0.4 MG/5ML IV SOLN
0.4000 mg | Freq: Once | INTRAVENOUS | Status: AC
Start: 2017-01-31 — End: 2017-01-31
  Administered 2017-01-31: 0.4 mg via INTRAVENOUS

## 2017-02-04 ENCOUNTER — Encounter (HOSPITAL_COMMUNITY): Payer: Self-pay

## 2017-02-04 ENCOUNTER — Other Ambulatory Visit (HOSPITAL_COMMUNITY): Payer: Self-pay

## 2017-02-14 ENCOUNTER — Other Ambulatory Visit: Payer: BLUE CROSS/BLUE SHIELD

## 2017-02-25 ENCOUNTER — Other Ambulatory Visit: Payer: BLUE CROSS/BLUE SHIELD

## 2017-02-25 ENCOUNTER — Ambulatory Visit (INDEPENDENT_AMBULATORY_CARE_PROVIDER_SITE_OTHER): Payer: BLUE CROSS/BLUE SHIELD | Admitting: Family Medicine

## 2017-02-25 ENCOUNTER — Encounter: Payer: Self-pay | Admitting: Gastroenterology

## 2017-02-25 ENCOUNTER — Encounter: Payer: Self-pay | Admitting: Family Medicine

## 2017-02-25 VITALS — BP 150/80 | HR 87 | Temp 97.9°F | Wt 165.8 lb

## 2017-02-25 DIAGNOSIS — Z789 Other specified health status: Secondary | ICD-10-CM | POA: Diagnosis not present

## 2017-02-25 DIAGNOSIS — K4090 Unilateral inguinal hernia, without obstruction or gangrene, not specified as recurrent: Secondary | ICD-10-CM | POA: Diagnosis not present

## 2017-02-25 DIAGNOSIS — E7800 Pure hypercholesterolemia, unspecified: Secondary | ICD-10-CM

## 2017-02-25 DIAGNOSIS — Z1211 Encounter for screening for malignant neoplasm of colon: Secondary | ICD-10-CM

## 2017-02-25 NOTE — Progress Notes (Signed)
He had cards f/u.  D/w pt about neg w/u.  Still smoking. Cessation likely more important than statin , d/w pt.  He clearly couldn't tolerate statin use, even with lower dose, d/w pt.  Added to intolerance list.  D/w pt.    He thought he may have a hernia in the R lower abdomen.  Painful locally.  He cannot feel a lump.  Locally tender.  He has been doing a lot of lifting.  Going on for a few weeks.    No blood in stool known but he is colorblind.  Some occ diarrhea.  No burning with urination.   D/w patient [id number] for colon cancer screening, including IFOB vs. colonoscopy.  Risks and benefits of both were discussed and patient voiced understanding.  Pt elects [id number].    Meds, vitals, and allergies reviewed.   ROS: Per HPI unless specifically indicated in ROS section   GEN: nad, alert and oriented HEENT: mucous membranes moist NECK: supple w/o LA CV: rrr PULM: ctab, no inc wob ABD: soft, +bs EXT: no edema SKIN: no acute rash I feel a hernia/bulge on the R inguinal area.  Locally tender but area is soft and self reduces.

## 2017-02-25 NOTE — Patient Instructions (Addendum)
Joseph Vaughn will call about your referral to the GI clinic.  I think I feel a hernia.  Keep an eye on it and update me if worse.  Relative rest in the meantime.   If severe pain then go to the ER.   Take care.  Glad to see you.  Update me as needed.

## 2017-02-26 DIAGNOSIS — K4090 Unilateral inguinal hernia, without obstruction or gangrene, not specified as recurrent: Secondary | ICD-10-CM | POA: Insufficient documentation

## 2017-02-26 DIAGNOSIS — Z1211 Encounter for screening for malignant neoplasm of colon: Secondary | ICD-10-CM | POA: Insufficient documentation

## 2017-02-26 DIAGNOSIS — Z789 Other specified health status: Secondary | ICD-10-CM | POA: Insufficient documentation

## 2017-02-26 NOTE — Assessment & Plan Note (Signed)
Likely the issue. Discussed with patient about relative rest. Limit straining or lifting. If more pain then we can refer to general surgery. He did not want to consider repair at this point. He is able to put up with the pain so this is reasonable for now. If he has any symptoms of incarceration or strangulation then go to the emergency room. Discussed with patient. He agrees.

## 2017-02-26 NOTE — Assessment & Plan Note (Signed)
D/w pt.  He'll work on smoking cessation for risk factor reduction.

## 2017-02-26 NOTE — Assessment & Plan Note (Signed)
Refer

## 2017-02-28 ENCOUNTER — Other Ambulatory Visit: Payer: BLUE CROSS/BLUE SHIELD

## 2017-03-01 ENCOUNTER — Telehealth: Payer: Self-pay | Admitting: Family Medicine

## 2017-03-01 DIAGNOSIS — K4090 Unilateral inguinal hernia, without obstruction or gangrene, not specified as recurrent: Secondary | ICD-10-CM

## 2017-03-01 NOTE — Telephone Encounter (Signed)
I put in the referral.  Thanks.  

## 2017-03-01 NOTE — Telephone Encounter (Signed)
Patient called and said he would like to be referred to a surgeon for his hernia.  Patient would like to go to [place].  Patient can go anytime.  Patient said the sooner the better.

## 2017-03-15 ENCOUNTER — Encounter: Payer: Self-pay | Admitting: Family Medicine

## 2017-03-18 ENCOUNTER — Encounter: Payer: Self-pay | Admitting: *Deleted

## 2017-04-17 ENCOUNTER — Encounter: Payer: Self-pay | Admitting: Gastroenterology

## 2017-05-13 ENCOUNTER — Telehealth: Payer: Self-pay | Admitting: Podiatry

## 2017-05-13 NOTE — Telephone Encounter (Signed)
Patient called wanting to know if Dr. Milinda Pointer had received his MRI report yet? Per Dr. Milinda Pointer, no he has not received. Patient advised that normally he sends out MRI for overread and it take an additional 10 days for the report.  Patient wanted [place] to know that he has been approved for financial assistance program thru [facility] and that if he required surgery it would not be done by [facility] but would have to be referred to a [facility] provider in order for it to be covered. Advised patient that we would contact him as soon as we get the results in and Dr. Milinda Pointer has reviewed them.

## 2017-05-14 ENCOUNTER — Telehealth: Payer: Self-pay

## 2017-05-14 NOTE — Telephone Encounter (Signed)
It has not showed up in the imaging or in the media yet.  We may have to call to get these results.

## 2017-05-14 NOTE — Telephone Encounter (Signed)
error 

## 2017-05-14 NOTE — Telephone Encounter (Signed)
This is the incorrect patient, please disregard all communications.

## 2018-02-02 DIAGNOSIS — H00014 Hordeolum externum left upper eyelid: Secondary | ICD-10-CM | POA: Diagnosis not present

## 2018-02-24 ENCOUNTER — Ambulatory Visit: Payer: Self-pay | Admitting: Family Medicine

## 2018-02-24 NOTE — Telephone Encounter (Signed)
This is skype that was sent to me from [place] at [facility];  [02/24/2018 2:00 PM]  Valli Glance:   Good afternoon Meklit Cotta- I just spoke to [employer]- He is working out of town- he states he thinks he may have broke a rib sneezing last week. He is not having any breathing problem- but does have pain with deep breathing and certain activities. He requested an appointment tomorrow- I did schedule him with good instructions.   Pt has appt 02/25/18 at 10:45 to see Dr Damita Dunnings.

## 2018-02-24 NOTE — Telephone Encounter (Signed)
Noted. Thanks. Will see tomorrow.  

## 2018-02-24 NOTE — Telephone Encounter (Signed)
Patient is calling because he thinks he may have a rib injury- patient states he had a sneezing fit last week and he injured his side. He still has pain and if he takes a deep breath in or does certain activities he has pain. Offered appointment today- but patient states he is working out of town- he states he will go to [facility] if he should have any changes at all- otherwise he will come to appointment in am.  Reason for Disposition . [1] Can't take a deep breath BUT [2] no respiratory distress  Answer Assessment - Initial Assessment Questions 1. MECHANISM: "How did the injury happen?"     Sneezing fit 2. ONSET: "When did the injury happen?" (Minutes or hours ago)     Last Thrusday 3. LOCATION: "Where on the chest is the injury located?"     Left side of chest- patient has pain from chest to waist line 4. APPEARANCE: "What does the injury look like?"     No apparent injury 5. BLEEDING: "Is there any bleeding now? If so, ask: How long has it been bleeding?"     no 6. SEVERITY: "Any difficulty with breathing?"     Pain with deep breath 7. SIZE: For cuts, bruises, or swelling, ask: "How large is it?" (e.g., inches or centimeters)     No briusing 8. PAIN: "Is there pain?" If so, ask: "How bad is the pain?"   (e.g., Scale 1-10; or mild, moderate, severe)     On left side 9. TETANUS: For any breaks in the skin, ask: "When was the last tetanus booster?"     n/a 10. PREGNANCY: "Is there any chance you are pregnant?" "When was your last menstrual period?"       n/a  Protocols used: CHEST INJURY-A-AH

## 2018-02-25 ENCOUNTER — Ambulatory Visit: Payer: BLUE CROSS/BLUE SHIELD | Admitting: Family Medicine

## 2018-02-25 ENCOUNTER — Encounter: Payer: Self-pay | Admitting: Family Medicine

## 2018-02-25 VITALS — BP 130/80 | HR 70 | Temp 97.7°F | Ht 65.0 in | Wt 175.0 lb

## 2018-02-25 DIAGNOSIS — H00019 Hordeolum externum unspecified eye, unspecified eyelid: Secondary | ICD-10-CM | POA: Diagnosis not present

## 2018-02-25 DIAGNOSIS — R0789 Other chest pain: Secondary | ICD-10-CM

## 2018-02-25 NOTE — Patient Instructions (Addendum)
Use warm compresses on your eyelid in the meantime.   Schedule a physical when possible.   Your lungs are clear.  Likely a chest wall strain.  Take aleve with food if needed, 1-2 per day if needed.   Take care.  Glad to see you.

## 2018-02-25 NOTE — Progress Notes (Signed)
Prev seen at [facility] for L upper eyelid stye.  Better in the meantime.  No vision changes.  D/w pt.  See avs.    He is cutting back on tobacco. Minimal use now.  A few cigs a day or less.    He had a sneezing fit, this was last week.  He felt something with the last sneeze on the L side of the chest, anterior.  He had trouble standing up from the pain.  The pain gradually got a little better but he was still sore after the fact, esp with cough/laugh/sneeze/strain.  This AM he is clearly better compared to yesterday.   Not SOB.  Can take a deep breath today w/o pain.  Minimally ttp locally.   He has been taking aleve prn, d/w pt about GI cautions.   Meds, vitals, and allergies reviewed.   ROS: Per HPI unless specifically indicated in ROS section   nad ncat but L upper eyelid stye noted.  No conjunctival irritation.  Mmm Neck supple, no LA rrr ctab L chest wall ttp anteriorly w/o bruising.   Ext w/o edema Skin well perfused.

## 2018-02-26 DIAGNOSIS — H00019 Hordeolum externum unspecified eye, unspecified eyelid: Secondary | ICD-10-CM | POA: Insufficient documentation

## 2018-02-26 DIAGNOSIS — R0789 Other chest pain: Secondary | ICD-10-CM | POA: Insufficient documentation

## 2018-02-26 NOTE — Assessment & Plan Note (Signed)
His lungs are clear.  Likely a chest wall strain.  Take aleve with food if needed, 1-2 per day if needed.   Routine cautions given.  No imaging needed at this point.  Clinically improved.  He agrees.

## 2018-02-26 NOTE — Assessment & Plan Note (Signed)
Use warm compresses and update me as needed.  He agrees.  See after visit summary.

## 2018-03-21 DIAGNOSIS — L820 Inflamed seborrheic keratosis: Secondary | ICD-10-CM | POA: Diagnosis not present

## 2018-03-21 DIAGNOSIS — L570 Actinic keratosis: Secondary | ICD-10-CM | POA: Diagnosis not present

## 2018-03-21 DIAGNOSIS — L814 Other melanin hyperpigmentation: Secondary | ICD-10-CM | POA: Diagnosis not present

## 2018-03-21 DIAGNOSIS — L821 Other seborrheic keratosis: Secondary | ICD-10-CM | POA: Diagnosis not present

## 2018-03-21 DIAGNOSIS — D229 Melanocytic nevi, unspecified: Secondary | ICD-10-CM | POA: Diagnosis not present

## 2018-05-31 ENCOUNTER — Encounter (HOSPITAL_COMMUNITY): Payer: Self-pay | Admitting: Emergency Medicine

## 2018-05-31 ENCOUNTER — Other Ambulatory Visit: Payer: Self-pay

## 2018-05-31 ENCOUNTER — Emergency Department (HOSPITAL_COMMUNITY): Payer: BLUE CROSS/BLUE SHIELD

## 2018-05-31 ENCOUNTER — Inpatient Hospital Stay (HOSPITAL_COMMUNITY)
Admission: EM | Admit: 2018-05-31 | Discharge: 2018-06-03 | DRG: 247 | Disposition: A | Discharge status: Home / Self Care | Payer: BLUE CROSS/BLUE SHIELD | Attending: Internal Medicine | Admitting: Internal Medicine

## 2018-05-31 DIAGNOSIS — I200 Unstable angina: Secondary | ICD-10-CM | POA: Diagnosis not present

## 2018-05-31 DIAGNOSIS — E785 Hyperlipidemia, unspecified: Secondary | ICD-10-CM | POA: Diagnosis not present

## 2018-05-31 DIAGNOSIS — I25119 Atherosclerotic heart disease of native coronary artery with unspecified angina pectoris: Secondary | ICD-10-CM | POA: Diagnosis present

## 2018-05-31 DIAGNOSIS — Z955 Presence of coronary angioplasty implant and graft: Secondary | ICD-10-CM

## 2018-05-31 DIAGNOSIS — I2510 Atherosclerotic heart disease of native coronary artery without angina pectoris: Secondary | ICD-10-CM | POA: Diagnosis not present

## 2018-05-31 DIAGNOSIS — H5350 Unspecified color vision deficiencies: Secondary | ICD-10-CM | POA: Diagnosis not present

## 2018-05-31 DIAGNOSIS — I454 Nonspecific intraventricular block: Secondary | ICD-10-CM | POA: Diagnosis not present

## 2018-05-31 DIAGNOSIS — Z79899 Other long term (current) drug therapy: Secondary | ICD-10-CM | POA: Diagnosis not present

## 2018-05-31 DIAGNOSIS — I25110 Atherosclerotic heart disease of native coronary artery with unstable angina pectoris: Secondary | ICD-10-CM | POA: Diagnosis not present

## 2018-05-31 DIAGNOSIS — R7989 Other specified abnormal findings of blood chemistry: Secondary | ICD-10-CM | POA: Diagnosis present

## 2018-05-31 DIAGNOSIS — R079 Chest pain, unspecified: Secondary | ICD-10-CM | POA: Diagnosis present

## 2018-05-31 DIAGNOSIS — I214 Non-ST elevation (NSTEMI) myocardial infarction: Principal | ICD-10-CM | POA: Diagnosis present

## 2018-05-31 DIAGNOSIS — Z789 Other specified health status: Secondary | ICD-10-CM | POA: Diagnosis present

## 2018-05-31 DIAGNOSIS — Z87442 Personal history of urinary calculi: Secondary | ICD-10-CM | POA: Diagnosis not present

## 2018-05-31 DIAGNOSIS — I361 Nonrheumatic tricuspid (valve) insufficiency: Secondary | ICD-10-CM | POA: Diagnosis not present

## 2018-05-31 DIAGNOSIS — R0789 Other chest pain: Secondary | ICD-10-CM

## 2018-05-31 DIAGNOSIS — E7800 Pure hypercholesterolemia, unspecified: Secondary | ICD-10-CM | POA: Insufficient documentation

## 2018-05-31 DIAGNOSIS — I447 Left bundle-branch block, unspecified: Secondary | ICD-10-CM | POA: Diagnosis not present

## 2018-05-31 DIAGNOSIS — I129 Hypertensive chronic kidney disease with stage 1 through stage 4 chronic kidney disease, or unspecified chronic kidney disease: Secondary | ICD-10-CM | POA: Diagnosis not present

## 2018-05-31 DIAGNOSIS — I370 Nonrheumatic pulmonary valve stenosis: Secondary | ICD-10-CM | POA: Diagnosis not present

## 2018-05-31 DIAGNOSIS — Z888 Allergy status to other drugs, medicaments and biological substances status: Secondary | ICD-10-CM | POA: Diagnosis not present

## 2018-05-31 DIAGNOSIS — F3160 Bipolar disorder, current episode mixed, unspecified: Secondary | ICD-10-CM | POA: Diagnosis not present

## 2018-05-31 DIAGNOSIS — I10 Essential (primary) hypertension: Secondary | ICD-10-CM | POA: Diagnosis not present

## 2018-05-31 DIAGNOSIS — N179 Acute kidney failure, unspecified: Secondary | ICD-10-CM | POA: Diagnosis present

## 2018-05-31 DIAGNOSIS — N182 Chronic kidney disease, stage 2 (mild): Secondary | ICD-10-CM

## 2018-05-31 DIAGNOSIS — N183 Chronic kidney disease, stage 3 (moderate): Secondary | ICD-10-CM | POA: Diagnosis not present

## 2018-05-31 DIAGNOSIS — E782 Mixed hyperlipidemia: Secondary | ICD-10-CM | POA: Diagnosis not present

## 2018-05-31 DIAGNOSIS — F319 Bipolar disorder, unspecified: Secondary | ICD-10-CM | POA: Diagnosis not present

## 2018-05-31 DIAGNOSIS — R072 Precordial pain: Secondary | ICD-10-CM | POA: Diagnosis not present

## 2018-05-31 DIAGNOSIS — F17210 Nicotine dependence, cigarettes, uncomplicated: Secondary | ICD-10-CM | POA: Diagnosis not present

## 2018-05-31 DIAGNOSIS — Z8619 Personal history of other infectious and parasitic diseases: Secondary | ICD-10-CM

## 2018-05-31 HISTORY — DX: Atherosclerotic heart disease of native coronary artery without angina pectoris: I25.10

## 2018-05-31 HISTORY — DX: Personal history of urinary calculi: Z87.442

## 2018-05-31 LAB — COMPREHENSIVE METABOLIC PANEL
ALT: 24 U/L (ref 0–44)
AST: 24 U/L (ref 15–41)
Albumin: 3.8 g/dL (ref 3.5–5.0)
Alkaline Phosphatase: 73 U/L (ref 38–126)
Anion gap: 7 (ref 5–15)
BUN: 12 mg/dL (ref 8–23)
CO2: 28 mmol/L (ref 22–32)
Calcium: 9 mg/dL (ref 8.9–10.3)
Chloride: 104 mmol/L (ref 98–111)
Creatinine, Ser: 1.45 mg/dL — ABNORMAL HIGH (ref 0.61–1.24)
GFR calc Af Amer: 60 mL/min — ABNORMAL LOW (ref >60)
GFR calc non Af Amer: 52 mL/min — ABNORMAL LOW (ref >60)
Glucose, Bld: 112 mg/dL — ABNORMAL HIGH (ref 70–99)
Potassium: 3.9 mmol/L (ref 3.5–5.1)
Sodium: 139 mmol/L (ref 135–145)
Total Bilirubin: 0.4 mg/dL (ref 0.3–1.2)
Total Protein: 7 g/dL (ref 6.5–8.1)

## 2018-05-31 LAB — I-STAT CHEM 8, ED
BUN: 11 mg/dL (ref 8–23)
Calcium, Ion: 1.15 mmol/L (ref 1.15–1.40)
Chloride: 102 mmol/L (ref 98–111)
Creatinine, Ser: 1.5 mg/dL — ABNORMAL HIGH (ref 0.61–1.24)
Glucose, Bld: 107 mg/dL — ABNORMAL HIGH (ref 70–99)
HCT: 45 % (ref 39.0–52.0)
Hemoglobin: 15.3 g/dL (ref 13.0–17.0)
Potassium: 4 mmol/L (ref 3.5–5.1)
Sodium: 139 mmol/L (ref 135–145)
TCO2: 30 mmol/L (ref 22–32)

## 2018-05-31 LAB — CBC WITH DIFFERENTIAL/PLATELET
Abs Immature Granulocytes: 0.03 10*3/uL (ref 0.00–0.07)
Basophils Absolute: 0.1 10*3/uL (ref 0.0–0.1)
Basophils Relative: 1 %
Eosinophils Absolute: 0.3 10*3/uL (ref 0.0–0.5)
Eosinophils Relative: 3 %
HCT: 45.3 % (ref 39.0–52.0)
Hemoglobin: 14.9 g/dL (ref 13.0–17.0)
Immature Granulocytes: 0 %
Lymphocytes Relative: 24 %
Lymphs Abs: 2.4 10*3/uL (ref 0.7–4.0)
MCH: 31 pg (ref 26.0–34.0)
MCHC: 32.9 g/dL (ref 30.0–36.0)
MCV: 94.2 fL (ref 80.0–100.0)
Monocytes Absolute: 0.9 10*3/uL (ref 0.1–1.0)
Monocytes Relative: 9 %
Neutro Abs: 6.1 10*3/uL (ref 1.7–7.7)
Neutrophils Relative %: 63 %
Platelets: 216 10*3/uL (ref 150–400)
RBC: 4.81 MIL/uL (ref 4.22–5.81)
RDW: 12.3 % (ref 11.5–15.5)
WBC: 9.7 10*3/uL (ref 4.0–10.5)
nRBC: 0 % (ref 0.0–0.2)

## 2018-05-31 LAB — I-STAT TROPONIN, ED: Troponin i, poc: 0 ng/mL (ref 0.00–0.08)

## 2018-05-31 LAB — TROPONIN I
Troponin I: <0.03 ng/mL (ref <0.03)
Troponin I: 0.23 ng/mL (ref <0.03)

## 2018-05-31 MED ORDER — BUPROPION HCL ER (SR) 150 MG PO TB12
150.0000 mg | ORAL_TABLET | Freq: Every day | ORAL | Status: DC
  Administered 2018-06-01 – 2018-06-03 (×3): 150 mg via ORAL
  Filled 2018-05-31 (×5): qty 1

## 2018-05-31 MED ORDER — MORPHINE SULFATE (PF) 2 MG/ML IV SOLN
2.0000 mg | INTRAVENOUS | Status: DC | PRN
  Administered 2018-05-31: 2 mg via INTRAVENOUS
  Filled 2018-05-31: qty 1

## 2018-05-31 MED ORDER — MORPHINE SULFATE (PF) 2 MG/ML IV SOLN
2.0000 mg | Freq: Once | INTRAVENOUS | Status: AC
  Administered 2018-05-31: 2 mg via INTRAVENOUS
  Filled 2018-05-31: qty 1

## 2018-05-31 MED ORDER — ONDANSETRON HCL 4 MG/2ML IJ SOLN: 4.0000 mg | Freq: Four times a day (QID) | INTRAMUSCULAR | Status: DC | PRN

## 2018-05-31 MED ORDER — FAMOTIDINE 20 MG PO TABS
20.0000 mg | ORAL_TABLET | Freq: Once | ORAL | Status: AC
  Administered 2018-05-31: 20 mg via ORAL
  Filled 2018-05-31: qty 1

## 2018-05-31 MED ORDER — LAMOTRIGINE 150 MG PO TABS
150.0000 mg | ORAL_TABLET | Freq: Every day | ORAL | Status: DC
  Administered 2018-06-01 – 2018-06-03 (×3): 150 mg via ORAL
  Filled 2018-05-31 (×2): qty 1
  Filled 2018-05-31: qty 6

## 2018-05-31 MED ORDER — ACETAMINOPHEN 325 MG PO TABS: 650.0000 mg | ORAL_TABLET | ORAL | Status: DC | PRN

## 2018-05-31 MED ORDER — LIDOCAINE VISCOUS HCL 2 % MT SOLN
15.0000 mL | Freq: Once | OROMUCOSAL | Status: AC
  Administered 2018-05-31: 15 mL via ORAL
  Filled 2018-05-31: qty 15

## 2018-05-31 MED ORDER — ASPIRIN 325 MG PO TABS
325.0000 mg | ORAL_TABLET | Freq: Once | ORAL | Status: AC
  Administered 2018-05-31: 325 mg via ORAL
  Filled 2018-05-31: qty 1

## 2018-05-31 MED ORDER — NITROGLYCERIN 0.4 MG SL SUBL
0.4000 mg | SUBLINGUAL_TABLET | SUBLINGUAL | Status: DC | PRN
Start: 2018-05-31 — End: 2018-06-03

## 2018-05-31 MED ORDER — ASPIRIN EC 325 MG PO TBEC: 325.0000 mg | DELAYED_RELEASE_TABLET | Freq: Every day | ORAL | Status: DC

## 2018-05-31 MED ORDER — ALUM & MAG HYDROXIDE-SIMETH 200-200-20 MG/5ML PO SUSP
30.0000 mL | Freq: Once | ORAL | Status: AC
  Administered 2018-05-31: 30 mL via ORAL
  Filled 2018-05-31: qty 30

## 2018-05-31 NOTE — ED Notes (Signed)
Date and time results received: 05/31/18 10:31 PM  Test: troponin Critical Value: 0.23  Name of Provider Notified: Dr. Shanon Brow  Orders Received? Or Actions Taken? See orders
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
## 2018-05-31 NOTE — ED Notes (Signed)
Patient refused hospital bed.

## 2018-05-31 NOTE — ED Triage Notes (Signed)
CP in center of chest that radiates up neck, down through shoulders and both arms.

## 2018-05-31 NOTE — ED Notes (Signed)
Contacted [facility] for a hospital bed.
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
## 2018-06-01 ENCOUNTER — Inpatient Hospital Stay (HOSPITAL_COMMUNITY): Payer: BLUE CROSS/BLUE SHIELD

## 2018-06-01 DIAGNOSIS — Z79899 Other long term (current) drug therapy: Secondary | ICD-10-CM | POA: Diagnosis not present

## 2018-06-01 DIAGNOSIS — E782 Mixed hyperlipidemia: Secondary | ICD-10-CM

## 2018-06-01 DIAGNOSIS — Z789 Other specified health status: Secondary | ICD-10-CM | POA: Diagnosis not present

## 2018-06-01 DIAGNOSIS — I447 Left bundle-branch block, unspecified: Secondary | ICD-10-CM | POA: Diagnosis not present

## 2018-06-01 DIAGNOSIS — R072 Precordial pain: Secondary | ICD-10-CM | POA: Diagnosis not present

## 2018-06-01 DIAGNOSIS — F3160 Bipolar disorder, current episode mixed, unspecified: Secondary | ICD-10-CM

## 2018-06-01 DIAGNOSIS — N179 Acute kidney failure, unspecified: Secondary | ICD-10-CM | POA: Diagnosis not present

## 2018-06-01 DIAGNOSIS — I370 Nonrheumatic pulmonary valve stenosis: Secondary | ICD-10-CM

## 2018-06-01 DIAGNOSIS — I25119 Atherosclerotic heart disease of native coronary artery with unspecified angina pectoris: Secondary | ICD-10-CM | POA: Diagnosis not present

## 2018-06-01 DIAGNOSIS — N182 Chronic kidney disease, stage 2 (mild): Secondary | ICD-10-CM

## 2018-06-01 DIAGNOSIS — I361 Nonrheumatic tricuspid (valve) insufficiency: Secondary | ICD-10-CM

## 2018-06-01 DIAGNOSIS — I454 Nonspecific intraventricular block: Secondary | ICD-10-CM | POA: Diagnosis not present

## 2018-06-01 DIAGNOSIS — I2510 Atherosclerotic heart disease of native coronary artery without angina pectoris: Secondary | ICD-10-CM | POA: Diagnosis not present

## 2018-06-01 DIAGNOSIS — N183 Chronic kidney disease, stage 3 (moderate): Secondary | ICD-10-CM | POA: Diagnosis not present

## 2018-06-01 DIAGNOSIS — I10 Essential (primary) hypertension: Secondary | ICD-10-CM | POA: Diagnosis not present

## 2018-06-01 DIAGNOSIS — Z8619 Personal history of other infectious and parasitic diseases: Secondary | ICD-10-CM | POA: Diagnosis not present

## 2018-06-01 DIAGNOSIS — I214 Non-ST elevation (NSTEMI) myocardial infarction: Secondary | ICD-10-CM | POA: Diagnosis not present

## 2018-06-01 DIAGNOSIS — R7989 Other specified abnormal findings of blood chemistry: Secondary | ICD-10-CM | POA: Diagnosis not present

## 2018-06-01 DIAGNOSIS — Z87442 Personal history of urinary calculi: Secondary | ICD-10-CM | POA: Diagnosis not present

## 2018-06-01 DIAGNOSIS — F319 Bipolar disorder, unspecified: Secondary | ICD-10-CM | POA: Diagnosis not present

## 2018-06-01 DIAGNOSIS — E785 Hyperlipidemia, unspecified: Secondary | ICD-10-CM | POA: Diagnosis not present

## 2018-06-01 DIAGNOSIS — I25110 Atherosclerotic heart disease of native coronary artery with unstable angina pectoris: Secondary | ICD-10-CM | POA: Diagnosis not present

## 2018-06-01 DIAGNOSIS — F17210 Nicotine dependence, cigarettes, uncomplicated: Secondary | ICD-10-CM | POA: Diagnosis not present

## 2018-06-01 DIAGNOSIS — I129 Hypertensive chronic kidney disease with stage 1 through stage 4 chronic kidney disease, or unspecified chronic kidney disease: Secondary | ICD-10-CM | POA: Diagnosis not present

## 2018-06-01 DIAGNOSIS — E7800 Pure hypercholesterolemia, unspecified: Secondary | ICD-10-CM | POA: Diagnosis not present

## 2018-06-01 DIAGNOSIS — I200 Unstable angina: Secondary | ICD-10-CM | POA: Diagnosis not present

## 2018-06-01 DIAGNOSIS — Z888 Allergy status to other drugs, medicaments and biological substances status: Secondary | ICD-10-CM | POA: Diagnosis not present

## 2018-06-01 DIAGNOSIS — H5350 Unspecified color vision deficiencies: Secondary | ICD-10-CM | POA: Diagnosis not present

## 2018-06-01 HISTORY — DX: Non-ST elevation (NSTEMI) myocardial infarction: I21.4

## 2018-06-01 LAB — TROPONIN I
Troponin I: 0.35 ng/mL (ref <0.03)
Troponin I: 0.55 ng/mL (ref <0.03)

## 2018-06-01 LAB — ECHOCARDIOGRAM COMPLETE
Height: 65 in
Weight: 2737.6 oz

## 2018-06-01 LAB — HEMOGLOBIN A1C
Hgb A1c MFr Bld: 5.1 % (ref 4.8–5.6)
Mean Plasma Glucose: 99.67 mg/dL

## 2018-06-01 LAB — APTT: aPTT: 29 seconds (ref 24–36)

## 2018-06-01 LAB — PROTIME-INR
INR: 1.01
Prothrombin Time: 13.2 seconds (ref 11.4–15.2)

## 2018-06-01 LAB — HEPARIN LEVEL (UNFRACTIONATED): Heparin Unfractionated: 0.3 IU/mL (ref 0.30–0.70)

## 2018-06-01 MED ORDER — SODIUM CHLORIDE 0.9 % WEIGHT BASED INFUSION
3.0000 mL/kg/h | INTRAVENOUS | Status: DC
  Administered 2018-06-02: 3 mL/kg/h via INTRAVENOUS

## 2018-06-01 MED ORDER — HEPARIN (PORCINE) 25000 UT/250ML-% IV SOLN
1000.0000 [IU]/h | INTRAVENOUS | Status: DC
  Administered 2018-06-01: 950 [IU]/h via INTRAVENOUS
  Administered 2018-06-01: 1000 [IU]/h via INTRAVENOUS
  Filled 2018-06-01 (×2): qty 250

## 2018-06-01 MED ORDER — SODIUM CHLORIDE 0.9 % IV SOLN: 250.0000 mL | INTRAVENOUS | Status: DC | PRN

## 2018-06-01 MED ORDER — ROSUVASTATIN CALCIUM 20 MG PO TABS
40.0000 mg | ORAL_TABLET | Freq: Every day | ORAL | Status: DC
  Administered 2018-06-01 – 2018-06-02 (×2): 40 mg via ORAL
  Filled 2018-06-01 (×2): qty 2

## 2018-06-01 MED ORDER — SODIUM CHLORIDE 0.9% FLUSH: 3.0000 mL | INTRAVENOUS | Status: DC | PRN

## 2018-06-01 MED ORDER — HEPARIN BOLUS VIA INFUSION
4000.0000 [IU] | Freq: Once | INTRAVENOUS | Status: AC
  Administered 2018-06-01: 4000 [IU] via INTRAVENOUS

## 2018-06-01 MED ORDER — ASPIRIN 81 MG PO CHEW
81.0000 mg | CHEWABLE_TABLET | ORAL | Status: AC
  Administered 2018-06-02: 81 mg via ORAL
  Filled 2018-06-01: qty 1

## 2018-06-01 MED ORDER — METOPROLOL SUCCINATE ER 25 MG PO TB24
25.0000 mg | ORAL_TABLET | Freq: Every day | ORAL | Status: DC
  Administered 2018-06-01 – 2018-06-03 (×3): 25 mg via ORAL
  Filled 2018-06-01 (×4): qty 1

## 2018-06-01 MED ORDER — SODIUM CHLORIDE 0.9% FLUSH
3.0000 mL | Freq: Two times a day (BID) | INTRAVENOUS | Status: DC
  Administered 2018-06-01: 3 mL via INTRAVENOUS

## 2018-06-01 MED ORDER — SODIUM CHLORIDE 0.9 % WEIGHT BASED INFUSION
1.0000 mL/kg/h | INTRAVENOUS | Status: DC
  Administered 2018-06-02: 1 mL/kg/h via INTRAVENOUS

## 2018-06-01 MED ORDER — ASPIRIN EC 81 MG PO TBEC
81.0000 mg | DELAYED_RELEASE_TABLET | Freq: Every day | ORAL | Status: DC
  Administered 2018-06-01 – 2018-06-03 (×2): 81 mg via ORAL
  Filled 2018-06-01 (×2): qty 1

## 2018-06-01 NOTE — Progress Notes (Signed)
Patient is a 55-year male with past medical history of left bundle branch block, tobacco abuse, hyperlipidemia, hypertension, bipolar disorder, depression ,Tobacco use who was [facility] for further evaluation of his pain.  There was concern of non-STEMI.  Cardiology consulted.  Started on heparin drip. Also found to have elevated troponins.  Echocardiogram has been ordered. Patient has already been seen by cardiology.  Plan for left heart catheterization in the a.m.  He will be kept n.p.o. after midnight.  Continue aspirin, metoprolol, high-dose statin.  Will check fasting lipid panel and hemoglobin A1c. Patient seen and examined the bedside.  Currently is chest pain-free.  Denies any other specific complaints.  Patient was seen by Dr. Carles Collet  this morning.

## 2018-06-01 NOTE — ED Notes (Signed)
Date and time results received: 06/01/18 0059 (use smartphrase ".now" to insert current time)  Test: troponin Critical Value: 0.35  Name of Provider Notified: Dr Shanon Brow  Orders Received? Or Actions Taken?: see [facility] for interventions

## 2018-06-01 NOTE — ED Notes (Signed)
Report given to [facility] with [place].

## 2018-06-01 NOTE — ED Notes (Addendum)
Joseph Vaughn with lab notified for need of blood draw. Joseph Vaughn at bedside for lab draw.

## 2018-06-01 NOTE — ED Notes (Signed)
Pt given crackers, peanut butter, and sprite per request and per diet ordered by physician.

## 2018-06-01 NOTE — ED Notes (Signed)
[place], NP called this nurse back and reported that he spoke with DR Shanon Brow who explained that echo will be performed tomorrow regardless which campus location. NP was also made aware of troponin levels trending up.

## 2018-06-01 NOTE — ED Notes (Signed)
PT left with [facility] at this time.  

## 2018-06-01 NOTE — ED Notes (Signed)
[facility] called will send a truck to transport pt.
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
## 2018-06-01 NOTE — ED Notes (Addendum)
Spoke with Dr Shanon Brow regarding last troponin level value and trend upwards. Also informed that I had not received call back from [place], NP after paging regarding echo for in the morning.

## 2018-06-01 NOTE — ED Notes (Addendum)
Spoke with Dr Shanon Brow regarding echo ordered to be performed in morning. Xray reported that echo's are not done on weekend. Dr Shanon Brow reported "floor coverage until 1 am" via amion - Therapist, sports) was sent message/paged by this nurse via [facility] stating patient in ED had order for echo to be performed tomorrow morning. Have not received response at this time.

## 2018-06-01 NOTE — Progress Notes (Signed)
[place] for Heparin Indication: chest pain/ACS  Allergies  Allergen Reactions  . Lipitor [Atorvastatin] Other (See Comments)    "Felt awful" on med    Patient Measurements: Height: 5\' 5"  (165.1 cm) Weight: 171 lb 1.6 oz (77.6 kg) IBW/kg (Calculated) : 61.5 kg HEPARIN DW (KG): 77.1 kg   Vital Signs: Temp: 97.8 F (36.6 C) (12/22 0958) Temp Source: Oral (12/22 0958) BP: 147/82 (12/22 0958) Pulse Rate: 62 (12/22 0958)  Labs: Recent Labs    05/31/18 1648 05/31/18 1652  05/31/18 2148 06/01/18 0028 06/01/18 0159 06/01/18 1030  HGB 14.9 15.3  --   --   --   --   --   HCT 45.3 45.0  --   --   --   --   --   PLT 216  --   --   --   --   --   --   APTT  --   --   --   --   --  29  --   LABPROT  --   --   --   --   --  13.2  --   INR  --   --   --   --   --  1.01  --   HEPARINUNFRC  --   --   --   --   --   --  0.30  CREATININE 1.45* 1.50*  --   --   --   --   --   TROPONINI  --   --    < > 0.23* 0.35* 0.55*  --    < > = values in this interval not displayed.   Estimated Creatinine Clearance: 49.7 mL/min (A) (by C-G formula based on SCr of 1.5 mg/dL (H)).   Assessment: 62 yo male on heparin for NSTEMI. No AC PTA. Heparin level 0.3 (low end of therapeutic range) on gtt at 950 units/hr. Baseline CBC wnl. No bleeding noted. Plan for cath 12/23.  Goal of Therapy:  Heparin level 0.3-0.7 units/ml   Plan:  Increase heparin to 1000 units/hr to keep in therapeutic range Check heparin level and CBC daily  Sherlon Handing, PharmD, BCPS Clinical pharmacist  **Pharmacist phone directory can now be found on [email or web address] (PW [email or web address]).  Listed under [place]. 06/01/2018,11:43 AM

## 2018-06-01 NOTE — Progress Notes (Signed)
PROGRESS NOTE  Joseph Vaughn:[id number] DOB: [birth year] DOA: 05/31/2018 PCP: Tonia Ghent, MD  Brief History:  62 year old male with a history of hypertension hyperlipidemia, bipolar disorder presenting with substernal chest discomfort that began around 4 PM on 05/31/2018 when he was sitting in his recliner watching television.  He described discomfort as a squeezing sensation with pressure radiating up to his neck.  It was moderate in nature with somewhat exacerbation with exertion.  He had denied any nausea, dizziness, or diaphoresis.  He stated the episode lasted about 1 hour.  Patient states that he had similar episode approximately 3 weeks ago after he woke up from bed in the morning lasting approximately 45 minutes.  The patient states that belching may have made it somewhat better.  However, he also notes that he has had some exertional dyspnea with activity over the past 2 to 3 weeks.  Unfortunately, the patient continues to smoke approximately 1/2 pack/day.  He had a  NM stress test on 01/31/2017 which showed a fixed apical inferior perfusion defect which was suspected to be due to attenuation rather than ischemia.  Upon presentation the emergency department, the patient was pain-free.  His initial blood pressure was 182/82.  His blood pressure did improve with morphine.  In addition, the patient had received metoprolol succinate in the emergency department.  Although his initial troponin was negative, his troponins have gradually risen to 0.55.  The patient was started on IV heparin.  As result, the patient is being transferred to [facility] for cardiology evaluation.  Assessment/Plan: Chest pain/elevated troponin -The patient is typical and atypical components -Elevated troponins concerning for NSTEMI -Continue IV heparin -Echocardiogram -Transfer to [facility] for cardiology consultation -Continue metoprolol succinate -Personally reviewed chest x-ray--no infiltrates  or pulmonary edema -Check hemoglobin A1c and lipids  Hyperlipidemia -The patient had intolerance to statins -Recheck lipids  Essential hypertension -Patient is not on any agents in the outpatient setting -Continue metoprolol succinate  Bipolar disorder -Continue Wellbutrin and Lamictal  CKD stage II -Baseline creatinine 1.2-1.4  Left bundle branch block -This is chronic -Personally reviewed EKG--sinus rhythm, LBBB  Right upper lobe nodules -will need nonurgent CT scan of the chest and outpatient surveillance -Incidental finding on chest x-ray  Tobacco abuse -I have discussed tobacco cessation with the patient.  I have counseled the patient regarding the negative impacts of continued tobacco use including but not limited to lung cancer, COPD, and cardiovascular disease.  I have discussed alternatives to tobacco and modalities that may help facilitate tobacco cessation including but not limited to biofeedback, hypnosis, and medications.  Total time spent with tobacco counseling was 4 minutes.      Disposition Plan:   Transfer to [facility] Family Communication:  No Family at bedside  Consultants:  Cardiology--Dr. Shanon Brow spoke with cards  Code Status:  FULL   DVT Prophylaxis:  IV Heparin   Procedures: As Listed in Progress Note Above  Antibiotics: None      Subjective: Patient denies fevers, chills, headache, chest pain, dyspnea, nausea, vomiting, diarrhea, abdominal pain, dysuria, hematuria, hematochezia, and melena.   Objective: Vitals:   06/01/18 0441 06/01/18 0530 06/01/18 0600 06/01/18 0630  BP: 139/79 125/63 125/70 131/73  Pulse: 63 65 69 62  Resp:  14 13 15   Temp:      TempSrc:      SpO2:  96% 95% 96%  Weight:      Height:  No intake or output data in the 24 hours ending 06/01/18 0722 Weight change:  Exam:   General:  Pt is alert, follows commands appropriately, not in acute distress  HEENT: No icterus, No thrush, No neck mass,  [place]/AT  Cardiovascular: RRR, S1/S2, no rubs, no gallops  Respiratory: CTA bilaterally, no wheezing, no crackles, no rhonchi  Abdomen: Soft/+BS, non tender, non distended, no guarding  Extremities: No edema, No lymphangitis, No petechiae, No rashes, no synovitis   Data Reviewed: I have personally reviewed following labs and imaging studies Basic Metabolic Panel: Recent Labs  Lab 05/31/18 1648 05/31/18 1652  NA 139 139  K 3.9 4.0  CL 104 102  CO2 28  --   GLUCOSE 112* 107*  BUN 12 11  CREATININE 1.45* 1.50*  CALCIUM 9.0  --    Liver Function Tests: Recent Labs  Lab 05/31/18 1648  AST 24  ALT 24  ALKPHOS 73  BILITOT 0.4  PROT 7.0  ALBUMIN 3.8   No results for input(s): LIPASE, AMYLASE in the last 168 hours. No results for input(s): AMMONIA in the last 168 hours. Coagulation Profile: Recent Labs  Lab 06/01/18 0159  INR 1.01   CBC: Recent Labs  Lab 05/31/18 1648 05/31/18 1652  WBC 9.7  --   NEUTROABS 6.1  --   HGB 14.9 15.3  HCT 45.3 45.0  MCV 94.2  --   PLT 216  --    Cardiac Enzymes: Recent Labs  Lab 05/31/18 1844 05/31/18 2148 06/01/18 0028 06/01/18 0159  TROPONINI <0.03 0.23* 0.35* 0.55*   BNP: Invalid input(s): POCBNP CBG: No results for input(s): GLUCAP in the last 168 hours. HbA1C: No results for input(s): HGBA1C in the last 72 hours. Urine analysis: No results found for: COLORURINE, APPEARANCEUR, LABSPEC, PHURINE, GLUCOSEU, HGBUR, BILIRUBINUR, KETONESUR, PROTEINUR, UROBILINOGEN, NITRITE, LEUKOCYTESUR Sepsis Labs: @LABRCNTIP (procalcitonin:4,lacticidven:4) )No results found for this or any previous visit (from the past 240 hour(s)).   Scheduled Meds: . aspirin EC  325 mg Oral Daily  . buPROPion  150 mg Oral Daily  . lamoTRIgine  150 mg Oral Daily  . metoprolol succinate  25 mg Oral Daily   Continuous Infusions: . heparin 950 Units/hr (06/01/18 0223)    Procedures/Studies: Dg Chest 2 View  Result Date: 05/31/2018 CLINICAL  DATA:  Chest pain EXAM: CHEST - 2 VIEW COMPARISON:  None. FINDINGS: The lungs are clear without focal pneumonia, edema, pneumothorax or pleural effusion. Tiny nodules identified right upper lobe. The cardiopericardial silhouette is within normal limits for size. The visualized bony structures of the thorax are intact. Telemetry leads overlie the chest. IMPRESSION: Tiny right upper lobe pulmonary nodules, likely granulomata. CT chest without contrast could be used to confirm. Otherwise unremarkable. Electronically Signed   By: Misty Stanley M.D.   On: 05/31/2018 17:21    Orson Eva, DO  [facility] Pager [phone]  If 7PM-7AM, please contact night-coverage [email or web address] Password [email or web address] 06/01/2018, 7:22 AM   LOS: 0 days
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
## 2018-06-01 NOTE — Progress Notes (Signed)
06/01/2018 0950 Received pt to room 4E-03 from [facility].  Pt is A&O, no C/O voiced at this time.  Tele monitor applied and CCMD notified.  CHG bath given.  Oriented pt to room, call light and bed.  Call bell in reach. [place]
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
## 2018-06-01 NOTE — ED Notes (Signed)
Dr Shanon Brow has informed me that pt will be admitted transferred to [facility]- pt was updated on his plan of care.

## 2018-06-01 NOTE — ED Notes (Signed)
Date and time results received: 06/01/18 2:34 AM (use smartphrase ".now" to insert current time)  Test: troponin Critical Value: 0.55  Name of Provider Notified: Dr Shanon Brow  Orders Received? Or Actions Taken?: see EMR for details and any new interventions

## 2018-06-01 NOTE — Progress Notes (Signed)
*  PRELIMINARY RESULTS* Echocardiogram 2D Echocardiogram has been performed.  Samuel Germany 06/01/2018, 1:49 PM

## 2018-06-01 NOTE — ED Notes (Signed)
Pt up to bathroom and back to room- steady gait noted.

## 2018-06-01 NOTE — ED Notes (Signed)
[place], NP called back and spoke with this nurse regarding pt needing echo in the morning per Dr Shanon Brow. NP was unsure if pt was supposed to have echo performed here or elsewhere. Explained that this nurse was unsure about day time procedures as I work night shift, but did explain I had called xray to find out and they reported that cardiology staff were not here on weekends. NP reports he will call Dr Shanon Brow to discuss plan of care and will call me back.

## 2018-06-01 NOTE — ED Notes (Addendum)
Have not received call back after paging Shanon Brow - this nurse asked Nelwyn Salisbury, ED sec to page Dr Shanon Brow again.

## 2018-06-01 NOTE — ED Notes (Signed)
Report given to Juliann Pulse, RN for room [phone]

## 2018-06-02 ENCOUNTER — Encounter (HOSPITAL_COMMUNITY)
Admission: EM | Disposition: A | Discharge status: Home / Self Care | Payer: Self-pay | Source: Home / Self Care | Attending: Internal Medicine

## 2018-06-02 ENCOUNTER — Other Ambulatory Visit: Payer: Self-pay

## 2018-06-02 ENCOUNTER — Encounter (HOSPITAL_COMMUNITY): Payer: Self-pay | Admitting: Cardiovascular Disease

## 2018-06-02 DIAGNOSIS — I10 Essential (primary) hypertension: Secondary | ICD-10-CM

## 2018-06-02 DIAGNOSIS — Z789 Other specified health status: Secondary | ICD-10-CM

## 2018-06-02 DIAGNOSIS — I454 Nonspecific intraventricular block: Secondary | ICD-10-CM

## 2018-06-02 DIAGNOSIS — I25110 Atherosclerotic heart disease of native coronary artery with unstable angina pectoris: Secondary | ICD-10-CM

## 2018-06-02 DIAGNOSIS — E7800 Pure hypercholesterolemia, unspecified: Secondary | ICD-10-CM

## 2018-06-02 HISTORY — PX: INTRAVASCULAR PRESSURE WIRE/FFR STUDY: CATH118243

## 2018-06-02 HISTORY — PX: CORONARY STENT INTERVENTION: CATH118234

## 2018-06-02 HISTORY — PX: LEFT HEART CATH AND CORONARY ANGIOGRAPHY: CATH118249

## 2018-06-02 LAB — CBC
HCT: 46.4 % (ref 39.0–52.0)
Hemoglobin: 15.5 g/dL (ref 13.0–17.0)
MCH: 31.2 pg (ref 26.0–34.0)
MCHC: 33.4 g/dL (ref 30.0–36.0)
MCV: 93.4 fL (ref 80.0–100.0)
Platelets: 238 10*3/uL (ref 150–400)
RBC: 4.97 MIL/uL (ref 4.22–5.81)
RDW: 12.2 % (ref 11.5–15.5)
WBC: 10.2 10*3/uL (ref 4.0–10.5)
nRBC: 0 % (ref 0.0–0.2)

## 2018-06-02 LAB — BASIC METABOLIC PANEL
Anion gap: 13 (ref 5–15)
BUN: 14 mg/dL (ref 8–23)
CO2: 25 mmol/L (ref 22–32)
Calcium: 9.1 mg/dL (ref 8.9–10.3)
Chloride: 102 mmol/L (ref 98–111)
Creatinine, Ser: 1.36 mg/dL — ABNORMAL HIGH (ref 0.61–1.24)
GFR calc Af Amer: >60 mL/min (ref >60)
GFR calc non Af Amer: 56 mL/min — ABNORMAL LOW (ref >60)
Glucose, Bld: 98 mg/dL (ref 70–99)
Potassium: 4.2 mmol/L (ref 3.5–5.1)
Sodium: 140 mmol/L (ref 135–145)

## 2018-06-02 LAB — LIPID PANEL
Cholesterol: 249 mg/dL — ABNORMAL HIGH (ref 0–200)
HDL: 30 mg/dL — ABNORMAL LOW (ref >40)
LDL Cholesterol: 166 mg/dL — ABNORMAL HIGH (ref 0–99)
Total CHOL/HDL Ratio: 8.3 RATIO
Triglycerides: 263 mg/dL — ABNORMAL HIGH (ref <150)
VLDL: 53 mg/dL — ABNORMAL HIGH (ref 0–40)

## 2018-06-02 LAB — POCT ACTIVATED CLOTTING TIME: Activated Clotting Time: 455 seconds

## 2018-06-02 LAB — HEPARIN LEVEL (UNFRACTIONATED): Heparin Unfractionated: 0.43 IU/mL (ref 0.30–0.70)

## 2018-06-02 SURGERY — LEFT HEART CATH AND CORONARY ANGIOGRAPHY
Anesthesia: LOCAL

## 2018-06-02 MED ORDER — NITROGLYCERIN 1 MG/10 ML FOR IR/CATH LAB
INTRA_ARTERIAL | Status: AC
  Filled 2018-06-02: qty 10

## 2018-06-02 MED ORDER — LIDOCAINE HCL (PF) 1 % IJ SOLN
INTRAMUSCULAR | Status: DC | PRN
  Administered 2018-06-02: 2 mL

## 2018-06-02 MED ORDER — ASPIRIN 81 MG PO CHEW: 81.0000 mg | CHEWABLE_TABLET | Freq: Every day | ORAL | Status: DC

## 2018-06-02 MED ORDER — ANGIOPLASTY BOOK
Freq: Once | Status: AC
  Administered 2018-06-03: 1
  Filled 2018-06-02: qty 1

## 2018-06-02 MED ORDER — LABETALOL HCL 5 MG/ML IV SOLN: 10.0000 mg | INTRAVENOUS | Status: AC | PRN

## 2018-06-02 MED ORDER — LIDOCAINE HCL (PF) 1 % IJ SOLN
INTRAMUSCULAR | Status: AC
  Filled 2018-06-02: qty 30

## 2018-06-02 MED ORDER — BIVALIRUDIN BOLUS VIA INFUSION - CUPID
INTRAVENOUS | Status: DC | PRN
  Administered 2018-06-02: 58.2 mg via INTRAVENOUS

## 2018-06-02 MED ORDER — HEPARIN (PORCINE) 25000 UT/250ML-% IV SOLN
1000.0000 [IU]/h | INTRAVENOUS | Status: DC
  Administered 2018-06-02 – 2018-06-03 (×2): 1000 [IU]/h via INTRAVENOUS
  Filled 2018-06-02 (×2): qty 250

## 2018-06-02 MED ORDER — ONDANSETRON HCL 4 MG/2ML IJ SOLN: 4.0000 mg | Freq: Four times a day (QID) | INTRAMUSCULAR | Status: DC | PRN

## 2018-06-02 MED ORDER — HEPARIN (PORCINE) IN NACL 1000-0.9 UT/500ML-% IV SOLN
INTRAVENOUS | Status: AC
  Filled 2018-06-02: qty 500

## 2018-06-02 MED ORDER — HEPARIN (PORCINE) IN NACL 1000-0.9 UT/500ML-% IV SOLN
INTRAVENOUS | Status: DC | PRN
  Administered 2018-06-02: 500 mL

## 2018-06-02 MED ORDER — THE SENSUOUS HEART BOOK
Freq: Once | Status: AC
  Administered 2018-06-03: 05:00:00 1
  Filled 2018-06-02: qty 1

## 2018-06-02 MED ORDER — SODIUM CHLORIDE 0.9 % IV SOLN
INTRAVENOUS | Status: AC
  Administered 2018-06-02: 13:00:00 via INTRAVENOUS

## 2018-06-02 MED ORDER — BIVALIRUDIN TRIFLUOROACETATE 250 MG IV SOLR
INTRAVENOUS | Status: AC
  Filled 2018-06-02: qty 250

## 2018-06-02 MED ORDER — ADENOSINE (DIAGNOSTIC) 140MCG/KG/MIN
INTRAVENOUS | Status: DC | PRN
  Administered 2018-06-02: 140 ug/kg/min via INTRAVENOUS

## 2018-06-02 MED ORDER — SODIUM CHLORIDE 0.9 % IV SOLN
1.7500 mg/kg/h | INTRAVENOUS | Status: AC
  Administered 2018-06-02: 1.75 mg/kg/h via INTRAVENOUS
  Filled 2018-06-02: qty 250

## 2018-06-02 MED ORDER — IOHEXOL 350 MG/ML SOLN
INTRAVENOUS | Status: DC | PRN
  Administered 2018-06-02: 115 mL via INTRA_ARTERIAL

## 2018-06-02 MED ORDER — SODIUM CHLORIDE 0.9% FLUSH: 3.0000 mL | INTRAVENOUS | Status: DC | PRN

## 2018-06-02 MED ORDER — MORPHINE SULFATE (PF) 2 MG/ML IV SOLN
2.0000 mg | INTRAVENOUS | Status: DC | PRN
  Administered 2018-06-02 (×2): 2 mg via INTRAVENOUS
  Filled 2018-06-02 (×2): qty 1

## 2018-06-02 MED ORDER — SODIUM CHLORIDE 0.9 % IV SOLN
INTRAVENOUS | Status: AC | PRN
  Administered 2018-06-02 (×2): 1.75 mg/kg/h via INTRAVENOUS

## 2018-06-02 MED ORDER — NITROGLYCERIN 1 MG/10 ML FOR IR/CATH LAB
INTRA_ARTERIAL | Status: DC | PRN
  Administered 2018-06-02: 200 ug via INTRACORONARY

## 2018-06-02 MED ORDER — ADENOSINE 12 MG/4ML IV SOLN
INTRAVENOUS | Status: AC
  Filled 2018-06-02: qty 16

## 2018-06-02 MED ORDER — MIDAZOLAM HCL 2 MG/2ML IJ SOLN
INTRAMUSCULAR | Status: AC
  Filled 2018-06-02: qty 2

## 2018-06-02 MED ORDER — HEPARIN SODIUM (PORCINE) 1000 UNIT/ML IJ SOLN
INTRAMUSCULAR | Status: DC | PRN
  Administered 2018-06-02: 4000 [IU] via INTRAVENOUS

## 2018-06-02 MED ORDER — VERAPAMIL HCL 2.5 MG/ML IV SOLN
INTRAVENOUS | Status: AC
  Filled 2018-06-02: qty 2

## 2018-06-02 MED ORDER — VERAPAMIL HCL 2.5 MG/ML IV SOLN
INTRAVENOUS | Status: DC | PRN
  Administered 2018-06-02: 10 mL via INTRA_ARTERIAL

## 2018-06-02 MED ORDER — SODIUM CHLORIDE 0.9 % IV SOLN: 250.0000 mL | INTRAVENOUS | Status: DC | PRN

## 2018-06-02 MED ORDER — FENTANYL CITRATE (PF) 100 MCG/2ML IJ SOLN
INTRAMUSCULAR | Status: DC | PRN
  Administered 2018-06-02: 25 ug via INTRAVENOUS

## 2018-06-02 MED ORDER — TICAGRELOR 90 MG PO TABS
ORAL_TABLET | ORAL | Status: AC
  Filled 2018-06-02: qty 2

## 2018-06-02 MED ORDER — FENTANYL CITRATE (PF) 100 MCG/2ML IJ SOLN
INTRAMUSCULAR | Status: AC
  Filled 2018-06-02: qty 2

## 2018-06-02 MED ORDER — TICAGRELOR 90 MG PO TABS
90.0000 mg | ORAL_TABLET | Freq: Two times a day (BID) | ORAL | Status: DC
  Administered 2018-06-02 – 2018-06-03 (×2): 90 mg via ORAL
  Filled 2018-06-02 (×2): qty 1

## 2018-06-02 MED ORDER — SODIUM CHLORIDE 0.9% FLUSH
3.0000 mL | Freq: Two times a day (BID) | INTRAVENOUS | Status: DC
  Administered 2018-06-02: 13:00:00 3 mL via INTRAVENOUS

## 2018-06-02 MED ORDER — MIDAZOLAM HCL 2 MG/2ML IJ SOLN
INTRAMUSCULAR | Status: DC | PRN
  Administered 2018-06-02 (×3): 1 mg via INTRAVENOUS

## 2018-06-02 MED ORDER — HEART ATTACK BOUNCING BOOK
Freq: Once | Status: AC
  Administered 2018-06-03: 1
  Filled 2018-06-02: qty 1

## 2018-06-02 MED ORDER — ACETAMINOPHEN 325 MG PO TABS: 650.0000 mg | ORAL_TABLET | ORAL | Status: DC | PRN

## 2018-06-02 MED ORDER — HYDRALAZINE HCL 20 MG/ML IJ SOLN: 5.0000 mg | INTRAMUSCULAR | Status: AC | PRN

## 2018-06-02 MED ORDER — HEPARIN SODIUM (PORCINE) 1000 UNIT/ML IJ SOLN
INTRAMUSCULAR | Status: AC
  Filled 2018-06-02: qty 1

## 2018-06-02 MED ORDER — TICAGRELOR 90 MG PO TABS
ORAL_TABLET | ORAL | Status: DC | PRN
  Administered 2018-06-02: 180 mg via ORAL

## 2018-06-02 SURGICAL SUPPLY — 21 items
BALLN EMERGE MR 2.0X12 (BALLOONS) ×2
BALLN SAPPHIRE ~~LOC~~ 2.25X15 (BALLOONS) ×2 IMPLANT
BALLOON EMERGE MR 2.0X12 (BALLOONS) ×1 IMPLANT
CATH INFINITI 5 FR JL3.5 (CATHETERS) ×2 IMPLANT
CATH MICROCATH NAVVUS (MICROCATHETER) ×1 IMPLANT
CATH OPTITORQUE TIG 4.0 5F (CATHETERS) ×2 IMPLANT
CATH VISTA GUIDE 6FR XBLAD3.5 (CATHETERS) ×2 IMPLANT
DEVICE RAD COMP TR BAND LRG (VASCULAR PRODUCTS) ×2 IMPLANT
GLIDESHEATH SLEND A-KIT 6F 22G (SHEATH) ×2 IMPLANT
GUIDEWIRE INQWIRE 1.5J.035X260 (WIRE) ×1 IMPLANT
INQWIRE 1.5J .035X260CM (WIRE) ×2
KIT ENCORE 26 ADVANTAGE (KITS) ×2 IMPLANT
KIT ESSENTIALS PG (KITS) ×2 IMPLANT
KIT HEART LEFT (KITS) ×2 IMPLANT
MICROCATHETER NAVVUS (MICROCATHETER) ×2
PACK CARDIAC CATHETERIZATION (CUSTOM PROCEDURE TRAY) ×2 IMPLANT
STENT RESOLUTE ONYX 2.0X18 (Permanent Stent) ×2 IMPLANT
TRANSDUCER W/STOPCOCK (MISCELLANEOUS) ×2 IMPLANT
TUBING CIL FLEX 10 FLL-RA (TUBING) ×2 IMPLANT
WIRE ASAHI PROWATER 180CM (WIRE) ×2 IMPLANT
WIRE HI TORQ VERSACORE-J 145CM (WIRE) ×2 IMPLANT

## 2018-06-02 NOTE — Interval H&P Note (Signed)
Cath Lab Visit (complete for each Cath Lab visit)  Clinical Evaluation Leading to the Procedure:   ACS: Yes.    Non-ACS:    Anginal Classification: CCS III  Anti-ischemic medical therapy: No Therapy  Non-Invasive Test Results: No non-invasive testing performed  Prior CABG: No previous CABG      History and Physical Interval Note:  06/02/2018 9:29 AM  Joseph Vaughn  has presented today for surgery, with the diagnosis of NSTEMI  The various methods of treatment have been discussed with the patient and family. After consideration of risks, benefits and other options for treatment, the patient has consented to  Procedure(s): LEFT HEART CATH AND CORONARY ANGIOGRAPHY (N/A) as a surgical intervention .  The patient's history has been reviewed, patient examined, no change in status, stable for surgery.  I have reviewed the patient's chart and labs.  Questions were answered to the patient's satisfaction.     Quay Burow

## 2018-06-02 NOTE — Progress Notes (Signed)
[facility] for Heparin Indication: chest pain/ACS  Allergies  Allergen Reactions  . Lipitor [Atorvastatin] Other (See Comments)    "Felt awful" on med    Patient Measurements: Height: 5\' 5"  (165.1 cm) Weight: 171 lb 1.6 oz (77.6 kg) IBW/kg (Calculated) : 61.5 kg HEPARIN DW (KG): 77.1 kg   Vital Signs: Temp: 98.5 F (36.9 C) (12/23 1123) Temp Source: Oral (12/23 1123) BP: 130/75 (12/23 1123) Pulse Rate: 64 (12/23 1123)  Labs: Recent Labs    05/31/18 1648 05/31/18 1652  05/31/18 2148 06/01/18 0028 06/01/18 0159 06/01/18 1030 06/02/18 0323  HGB 14.9 15.3  --   --   --   --   --  15.5  HCT 45.3 45.0  --   --   --   --   --  46.4  PLT 216  --   --   --   --   --   --  238  APTT  --   --   --   --   --  29  --   --   LABPROT  --   --   --   --   --  13.2  --   --   INR  --   --   --   --   --  1.01  --   --   HEPARINUNFRC  --   --   --   --   --   --  0.30 0.43  CREATININE 1.45* 1.50*  --   --   --   --   --  1.36*  TROPONINI  --   --    < > 0.23* 0.35* 0.55*  --   --    < > = values in this interval not displayed.   Estimated Creatinine Clearance: 54.8 mL/min (A) (by C-G formula based on SCr of 1.36 mg/dL (H)).   Assessment: 61 yo male on heparin for NSTEMI. No AC PTA. Heparin level 0.3 (low end of therapeutic range) on gtt at 950 units/hr. Baseline CBC wnl. No bleeding noted.   Heparin level at goal this morning, cbc stable overnight.   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 Successful PCI to LAD, patient still needs staged PCI to Cfx. Orders received to continue full dose bival for 4 hours then transition back to heparin infusion.    Goal of Therapy:  Heparin level 0.3-0.7 units/ml   Plan:  Restart heparin 1000 units/hr at 1530 this afternoon Check heparin level and CBC daily  Erin Hearing PharmD., BCPS Clinical Pharmacist 06/02/2018 11:42 AM  **Pharmacist phone directory can now be found on [email or web address] (PW  [email or web address]).  Listed under [place]. 06/02/2018,11:40 AM

## 2018-06-02 NOTE — Progress Notes (Addendum)
Patient c/o  TRB on wrist causing  pain  & discomfort , site level 0, radial pulse2+, color WNL, warm to touch. Kept elevated on pillow , offered morphine for pain ,patient refused stated " it doesn't help" , offered ice pack but  Refused.Explained air  deflation to start 2 hrs post infussion of angiomax, patient upset,  Insisted site is tight , 3 cc air deflated, no bleeding noted , Bhagat PA notified , will come up and evaluate , patient made aware.site monitored , kept elevated on pillow.  Seen by  Dr Gwenlyn Found ,radial site  evaluated ,  patient agreed to get pain med at this time ,another  3 cc air deflated, no bleeding noted. Radial site remain level 0

## 2018-06-02 NOTE — Care Management (Signed)
#  6.   S/W ROBERT   @ [facility] [employer] # [phone]   BRILINTA  90 MG BID COVER- YES CO-PAY-  $ 367.32 TIER- NONE FORMULARY PRIOR APPROVAL- NO  DEDUCTIBLE : NOT MET  PREFERRED PHARMACY : YES   - [facility]

## 2018-06-02 NOTE — Care Management Note (Addendum)
Case Management Note  Patient Details  Name: KIYAN BURMESTER MRN: [id number] Date of Birth: 20-Dec-1955  Subjective/Objective:    From home with wife, s/p stent intervention, will be on brilinta, NCM gave patient the 30 day free card and the 5 dollar each month card.  The 5.00 each month card will go towards his deductilbe 200.00 and once the deductible is met he can use the 5.00 card.  Informed wife that Cardiologist will have samples in the office and if patient finds medication is still too high to let cardiologist know to switch to something else.               Action/Plan: DC home when ready.   Expected Discharge Date:                  Expected Discharge Plan:  Home/Self Care  In-House Referral:     Discharge planning Services  CM Consult, Medication Assistance  Post Acute Care Choice:    Choice offered to:     DME Arranged:    DME Agency:     HH Arranged:    HH Agency:     Status of Service:  Completed, signed off  If discussed at [employer] of Stay Meetings, dates discussed:    Additional Comments:  Zenon Mayo, RN 06/02/2018, 4:01 PM

## 2018-06-02 NOTE — Progress Notes (Signed)
PROGRESS NOTE    Joseph Vaughn  OXB:[id number] DOB: [birth year] DOA: 05/31/2018 PCP: Tonia Ghent, MD   Brief Narrative: Patient is a 58-year male with past medical history of  left bundle branch block, tobacco abuse, hyperlipidemia, hypertension, bipolar disorder, depression ,Tobacco use who was [facility] for further evaluation of his pain.  There was concern of non-STEMI.  Cardiology consulted.  Started on heparin drip.Also found to have elevated troponins. Echocardiogram  ordered. Plan for left heart catheterization this  a.m. Currently is chest pain-free.  Assessment & Plan:   Principal Problem:   Chest pain Active Problems:   Pure hypercholesterolemia   BBB (bundle branch block)   Statin intolerance   Bipolar affective disorder (HCC)   NSTEMI (non-ST elevated myocardial infarction) ([place])   Left bundle branch block   CKD (chronic kidney disease), stage II   Hyperlipidemia  Chest pain/NSTEMI: Presented with chest pain with elevated troponin.  Clinical presentation suggestive for NSTEMI.  Start on IV heparin.  Underwent  cardiac cath today.  Found to have significant coronary artery disease(residual mid to distal AV groove circumflex disease and an acute takeoff circumflex).  Underwent drug-eluting stent placement.  Plan is to continue aspirin and Brilinta for minimum of 12 months. Cardiology following. Echocardiogram showed mild left ventricular hypertrophy, normal systolic function ejection fraction within the normal range of 55 to 60%, no regional wall motion abnormality, grade 1 diastolic dysfunction  Hyperlipidemia: On Crestor at home.  LDL of 166.  Hemoglobin A1c of 5.1.  Hypertension: Presented with hypertension.  Not on any agents at home.  Started on metoprolol.  Currently blood pressure stable.  Left bundle branch block: Chronic  Right upper lobe nodules: Recommended nonemergent CT scan of the chest as an outpatient.  Incidental finding on the chest  x-ray.  Tobacco use: Counseled for cessation.          DVT prophylaxis:Heparin IV Code Status: Full Family Communication: None present at the bedside Disposition Plan: Home after cardiology clearance   Consultants: Cardiology  Procedures: Cardiac cath  Antimicrobials: None  Subjective: Patient seen and examined the bedside this morning.  Remains comfortable.  Denies any chest pain.  Awaiting cardiac cath today  Objective: Vitals:   06/02/18 1052 06/02/18 1057 06/02/18 1102 06/02/18 1123  BP: 127/79 125/72 131/79 130/75  Pulse: 90 92 87 64  Resp: 14 11 18 17   Temp:    98.5 F (36.9 C)  TempSrc:    Oral  SpO2: 97% 98% (!) 0% 94%  Weight:      Height:        Intake/Output Summary (Last 24 hours) at 06/02/2018 1315 Last data filed at 06/02/2018 0900 Gross per 24 hour  Intake 566.5 ml  Output -  Net 566.5 ml   Filed Weights   05/31/18 1612 06/01/18 0958  Weight: 77.1 kg 77.6 kg    Examination:  General exam: Appears calm and comfortable ,Not in distress,average built HEENT:PERRL,Oral mucosa moist, Ear/Nose normal on gross exam Respiratory system: Bilateral equal air entry, normal vesicular breath sounds, no wheezes or crackles  Cardiovascular system: S1 & S2 heard, RRR. No JVD, murmurs, rubs, gallops or clicks. No pedal edema. Gastrointestinal system: Abdomen is nondistended, soft and nontender. No organomegaly or masses felt. Normal bowel sounds heard. Central nervous system: Alert and oriented. No focal neurological deficits. Extremities: No edema, no clubbing ,no cyanosis, distal peripheral pulses palpable. Skin: No rashes, lesions or ulcers,no icterus ,no pallor MSK: Normal muscle bulk,tone ,power  Psychiatry: Judgement and insight appear normal. Mood & affect appropriate.     Data Reviewed: I have personally reviewed following labs and imaging studies  CBC: Recent Labs  Lab 05/31/18 1648 05/31/18 1652 06/02/18 0323  WBC 9.7  --  10.2    NEUTROABS 6.1  --   --   HGB 14.9 15.3 15.5  HCT 45.3 45.0 46.4  MCV 94.2  --  93.4  PLT 216  --  287   Basic Metabolic Panel: Recent Labs  Lab 05/31/18 1648 05/31/18 1652 06/02/18 0323  NA 139 139 140  K 3.9 4.0 4.2  CL 104 102 102  CO2 28  --  25  GLUCOSE 112* 107* 98  BUN 12 11 14   CREATININE 1.45* 1.50* 1.36*  CALCIUM 9.0  --  9.1   GFR: Estimated Creatinine Clearance: 54.8 mL/min (A) (by C-G formula based on SCr of 1.36 mg/dL (H)). Liver Function Tests: Recent Labs  Lab 05/31/18 1648  AST 24  ALT 24  ALKPHOS 73  BILITOT 0.4  PROT 7.0  ALBUMIN 3.8   No results for input(s): LIPASE, AMYLASE in the last 168 hours. No results for input(s): AMMONIA in the last 168 hours. Coagulation Profile: Recent Labs  Lab 06/01/18 0159  INR 1.01   Cardiac Enzymes: Recent Labs  Lab 05/31/18 1844 05/31/18 2148 06/01/18 0028 06/01/18 0159  TROPONINI <0.03 0.23* 0.35* 0.55*   BNP (last 3 results) No results for input(s): PROBNP in the last 8760 hours. HbA1C: Recent Labs    06/01/18 1222  HGBA1C 5.1   CBG: No results for input(s): GLUCAP in the last 168 hours. Lipid Profile: Recent Labs    06/02/18 0323  CHOL 249*  HDL 30*  LDLCALC 166*  TRIG 263*  CHOLHDL 8.3   Thyroid Function Tests: No results for input(s): TSH, T4TOTAL, FREET4, T3FREE, THYROIDAB in the last 72 hours. Anemia Panel: No results for input(s): VITAMINB12, FOLATE, FERRITIN, TIBC, IRON, RETICCTPCT in the last 72 hours. Sepsis Labs: No results for input(s): PROCALCITON, LATICACIDVEN in the last 168 hours.  No results found for this or any previous visit (from the past 240 hour(s)).       Radiology Studies: Dg Chest 2 View  Result Date: 05/31/2018 CLINICAL DATA:  Chest pain EXAM: CHEST - 2 VIEW COMPARISON:  None. FINDINGS: The lungs are clear without focal pneumonia, edema, pneumothorax or pleural effusion. Tiny nodules identified right upper lobe. The cardiopericardial silhouette is  within normal limits for size. The visualized bony structures of the thorax are intact. Telemetry leads overlie the chest. IMPRESSION: Tiny right upper lobe pulmonary nodules, likely granulomata. CT chest without contrast could be used to confirm. Otherwise unremarkable. Electronically Signed   By: Misty Stanley M.D.   On: 05/31/2018 17:21        Scheduled Meds: . aspirin EC  81 mg Oral Daily  . buPROPion  150 mg Oral Daily  . lamoTRIgine  150 mg Oral Daily  . metoprolol succinate  25 mg Oral Daily  . rosuvastatin  40 mg Oral q1800  . sodium chloride flush  3 mL Intravenous Q12H  . ticagrelor  90 mg Oral BID   Continuous Infusions: . sodium chloride 75 mL/hr at 06/02/18 1302  . sodium chloride    . bivalirudin (ANGIOMAX) infusion 5 mg/mL (Cath Lab,ACS,PCI indication) 1.75 mg/kg/hr (06/02/18 1256)  . heparin       LOS: 1 day    Time spent:35 mins. More than 50% of that time was spent  in counseling and/or coordination of care.      Shelly Coss, MD [facility] Pager [phone]  If 7PM-7AM, please contact night-coverage [email or web address] Password [email or web address] 06/02/2018, 1:15 PM

## 2018-06-02 NOTE — Progress Notes (Signed)
Progress Note  Patient Name: Joseph Vaughn Date of Encounter: 06/02/2018  Primary Cardiologist: Jenkins Rouge, MD   Subjective   Feeling well. No chest pain, sob or palpitations.   Inpatient Medications    Scheduled Meds: . aspirin EC  81 mg Oral Daily  . buPROPion  150 mg Oral Daily  . lamoTRIgine  150 mg Oral Daily  . metoprolol succinate  25 mg Oral Daily  . rosuvastatin  40 mg Oral q1800  . sodium chloride flush  3 mL Intravenous Q12H   Continuous Infusions: . sodium chloride    . sodium chloride 1 mL/kg/hr (06/02/18 0532)  . heparin 1,000 Units/hr (06/01/18 2230)   PRN Meds: sodium chloride, acetaminophen, morphine injection, nitroGLYCERIN, ondansetron (ZOFRAN) IV, sodium chloride flush   Vital Signs    Vitals:   06/01/18 0958 06/01/18 1223 06/01/18 2049 06/02/18 0640  BP: (!) 147/82 (!) 142/80 (!) 146/83 132/80  Pulse: 62  73 60  Resp: 16 18 20 13   Temp: 97.8 F (36.6 C) 98 F (36.7 C) 97.9 F (36.6 C) 97.6 F (36.4 C)  TempSrc: Oral  Oral Oral  SpO2:   95% 96%  Weight: 77.6 kg     Height: 5\' 5"  (1.651 m)       Intake/Output Summary (Last 24 hours) at 06/02/2018 0904 Last data filed at 06/02/2018 0300 Gross per 24 hour  Intake 806.5 ml  Output 2 ml  Net 804.5 ml   Filed Weights   05/31/18 1612 06/01/18 0958  Weight: 77.1 kg 77.6 kg    Telemetry    SR - Personally Reviewed  ECG    None today   Physical Exam   GEN: No acute distress.   Neck: No JVD Cardiac: RRR, no murmurs, rubs, or gallops.  Respiratory: Clear to auscultation bilaterally. GI: Soft, nontender, non-distended  MS: No edema; No deformity. Neuro:  Nonfocal  Psych: Normal affect   Labs    Chemistry Recent Labs  Lab 05/31/18 1648 05/31/18 1652 06/02/18 0323  NA 139 139 140  K 3.9 4.0 4.2  CL 104 102 102  CO2 28  --  25  GLUCOSE 112* 107* 98  BUN 12 11 14   CREATININE 1.45* 1.50* 1.36*  CALCIUM 9.0  --  9.1  PROT 7.0  --   --   ALBUMIN 3.8  --   --     AST 24  --   --   ALT 24  --   --   ALKPHOS 73  --   --   BILITOT 0.4  --   --   GFRNONAA 52*  --  56*  GFRAA 60*  --  >60  ANIONGAP 7  --  13     Hematology Recent Labs  Lab 05/31/18 1648 05/31/18 1652 06/02/18 0323  WBC 9.7  --  10.2  RBC 4.81  --  4.97  HGB 14.9 15.3 15.5  HCT 45.3 45.0 46.4  MCV 94.2  --  93.4  MCH 31.0  --  31.2  MCHC 32.9  --  33.4  RDW 12.3  --  12.2  PLT 216  --  238    Cardiac Enzymes Recent Labs  Lab 05/31/18 1844 05/31/18 2148 06/01/18 0028 06/01/18 0159  TROPONINI <0.03 0.23* 0.35* 0.55*    Recent Labs  Lab 05/31/18 1650  TROPIPOC 0.00     BNPNo results for input(s): BNP, PROBNP in the last 168 hours.   DDimer No results for input(s): DDIMER in  the last 168 hours.   Radiology    Dg Chest 2 View  Result Date: 05/31/2018 CLINICAL DATA:  Chest pain EXAM: CHEST - 2 VIEW COMPARISON:  None. FINDINGS: The lungs are clear without focal pneumonia, edema, pneumothorax or pleural effusion. Tiny nodules identified right upper lobe. The cardiopericardial silhouette is within normal limits for size. The visualized bony structures of the thorax are intact. Telemetry leads overlie the chest. IMPRESSION: Tiny right upper lobe pulmonary nodules, likely granulomata. CT chest without contrast could be used to confirm. Otherwise unremarkable. Electronically Signed   By: Misty Stanley M.D.   On: 05/31/2018 17:21    Cardiac Studies   Echo 06/01/18 Study Conclusions  - Left ventricle: The cavity size was normal. Wall thickness was   increased in a pattern of mild LVH. Systolic function was normal.   The estimated ejection fraction was in the range of 55% to 60%.   Wall motion was normal; there were no regional wall motion   abnormalities. Doppler parameters are consistent with abnormal   left ventricular relaxation (grade 1 diastolic dysfunction). - Atrial septum: No defect or patent foramen ovale was identified.  Patient Profile     SKIP LITKE is a 62 y.o. male with a hx of LBBB, tobacco abuse, HLD w/ statin intolerance and HTN, as well as h/o bipolar disorder and depreession seen for NSTEMI.   Assessment & Plan    1. NSTEMI - Presented with classic anginal symptoms. Troponin less than 0.03>>0.23>>0.35>>0.55>> then stop. On Heparin. For cath today. Echo showed LVEF of 55-60% and grade 1 DD. Continue ASA, statin and BB.   2. HLD - 06/02/2018: Cholesterol 249; HDL 30; LDL Cholesterol 166; Triglycerides 263; VLDL 53  - Continue Crestor 40mg  daily.   For questions or updates, please contact [place] Please consult [email or web address] for contact info under        SignedLeanor Kail, PA  06/02/2018, 9:04 AM

## 2018-06-03 ENCOUNTER — Encounter (HOSPITAL_COMMUNITY): Payer: Self-pay | Admitting: Cardiovascular Disease

## 2018-06-03 ENCOUNTER — Inpatient Hospital Stay (HOSPITAL_COMMUNITY)
Admission: EM | Disposition: A | Discharge status: Home / Self Care | Payer: Self-pay | Source: Home / Self Care | Attending: Internal Medicine

## 2018-06-03 DIAGNOSIS — I200 Unstable angina: Secondary | ICD-10-CM

## 2018-06-03 DIAGNOSIS — I2510 Atherosclerotic heart disease of native coronary artery without angina pectoris: Secondary | ICD-10-CM

## 2018-06-03 HISTORY — PX: CORONARY STENT INTERVENTION: CATH118234

## 2018-06-03 LAB — CBC
HCT: 46.8 % (ref 39.0–52.0)
Hemoglobin: 15.6 g/dL (ref 13.0–17.0)
MCH: 30.5 pg (ref 26.0–34.0)
MCHC: 33.3 g/dL (ref 30.0–36.0)
MCV: 91.6 fL (ref 80.0–100.0)
Platelets: 213 10*3/uL (ref 150–400)
RBC: 5.11 MIL/uL (ref 4.22–5.81)
RDW: 11.9 % (ref 11.5–15.5)
WBC: 11 10*3/uL — ABNORMAL HIGH (ref 4.0–10.5)
nRBC: 0 % (ref 0.0–0.2)

## 2018-06-03 LAB — BASIC METABOLIC PANEL
Anion gap: 9 (ref 5–15)
BUN: 13 mg/dL (ref 8–23)
CO2: 27 mmol/L (ref 22–32)
Calcium: 9.4 mg/dL (ref 8.9–10.3)
Chloride: 102 mmol/L (ref 98–111)
Creatinine, Ser: 1.5 mg/dL — ABNORMAL HIGH (ref 0.61–1.24)
GFR calc Af Amer: 57 mL/min — ABNORMAL LOW (ref >60)
GFR calc non Af Amer: 50 mL/min — ABNORMAL LOW (ref >60)
Glucose, Bld: 96 mg/dL (ref 70–99)
Potassium: 4.5 mmol/L (ref 3.5–5.1)
Sodium: 138 mmol/L (ref 135–145)

## 2018-06-03 LAB — HEPARIN LEVEL (UNFRACTIONATED): Heparin Unfractionated: 0.38 IU/mL (ref 0.30–0.70)

## 2018-06-03 LAB — POCT ACTIVATED CLOTTING TIME: Activated Clotting Time: 389 seconds

## 2018-06-03 SURGERY — CORONARY STENT INTERVENTION
Anesthesia: LOCAL

## 2018-06-03 MED ORDER — HEPARIN (PORCINE) IN NACL 1000-0.9 UT/500ML-% IV SOLN
INTRAVENOUS | Status: DC | PRN
  Administered 2018-06-03 (×2): 500 mL

## 2018-06-03 MED ORDER — SODIUM CHLORIDE 0.9% FLUSH: 3.0000 mL | Freq: Two times a day (BID) | INTRAVENOUS | Status: DC

## 2018-06-03 MED ORDER — TICAGRELOR 90 MG PO TABS: 90.0000 mg | ORAL_TABLET | Freq: Two times a day (BID) | ORAL | 11 refills | Status: DC

## 2018-06-03 MED ORDER — ASPIRIN 81 MG PO TBEC: 81.0000 mg | DELAYED_RELEASE_TABLET | Freq: Every day | ORAL | 3 refills | Status: AC

## 2018-06-03 MED ORDER — HEPARIN SODIUM (PORCINE) 1000 UNIT/ML IJ SOLN
INTRAMUSCULAR | Status: AC
  Filled 2018-06-03: qty 1

## 2018-06-03 MED ORDER — SODIUM CHLORIDE 0.9 % IV SOLN: 250.0000 mL | INTRAVENOUS | Status: DC | PRN

## 2018-06-03 MED ORDER — SODIUM CHLORIDE 0.9 % IV SOLN: INTRAVENOUS | Status: AC

## 2018-06-03 MED ORDER — NITROGLYCERIN 1 MG/10 ML FOR IR/CATH LAB
INTRA_ARTERIAL | Status: AC
  Filled 2018-06-03: qty 10

## 2018-06-03 MED ORDER — SODIUM CHLORIDE 0.9% FLUSH: 3.0000 mL | INTRAVENOUS | Status: DC | PRN

## 2018-06-03 MED ORDER — VERAPAMIL HCL 2.5 MG/ML IV SOLN
INTRAVENOUS | Status: AC
  Filled 2018-06-03: qty 2

## 2018-06-03 MED ORDER — ROSUVASTATIN CALCIUM 40 MG PO TABS: 40.0000 mg | ORAL_TABLET | Freq: Every day | ORAL | 6 refills | Status: DC

## 2018-06-03 MED ORDER — HYDRALAZINE HCL 20 MG/ML IJ SOLN: 5.0000 mg | INTRAMUSCULAR | Status: DC | PRN

## 2018-06-03 MED ORDER — LIDOCAINE HCL (PF) 1 % IJ SOLN
INTRAMUSCULAR | Status: AC
  Filled 2018-06-03: qty 30

## 2018-06-03 MED ORDER — IOHEXOL 350 MG/ML SOLN
INTRAVENOUS | Status: DC | PRN
  Administered 2018-06-03: 85 mL via INTRAVENOUS

## 2018-06-03 MED ORDER — MIDAZOLAM HCL 2 MG/2ML IJ SOLN
INTRAMUSCULAR | Status: DC | PRN
  Administered 2018-06-03: 2 mg via INTRAVENOUS

## 2018-06-03 MED ORDER — OXYCODONE-ACETAMINOPHEN 5-325 MG PO TABS
1.0000 | ORAL_TABLET | ORAL | Status: DC | PRN
  Administered 2018-06-03: 1 via ORAL
  Filled 2018-06-03: qty 1

## 2018-06-03 MED ORDER — METOPROLOL SUCCINATE ER 25 MG PO TB24: 25.0000 mg | ORAL_TABLET | Freq: Every day | ORAL | 6 refills | Status: DC

## 2018-06-03 MED ORDER — FENTANYL CITRATE (PF) 100 MCG/2ML IJ SOLN
INTRAMUSCULAR | Status: DC | PRN
  Administered 2018-06-03: 50 ug via INTRAVENOUS

## 2018-06-03 MED ORDER — ASPIRIN 81 MG PO CHEW
81.0000 mg | CHEWABLE_TABLET | ORAL | Status: AC
  Administered 2018-06-03: 81 mg via ORAL
  Filled 2018-06-03: qty 1

## 2018-06-03 MED ORDER — LIDOCAINE HCL (PF) 1 % IJ SOLN
INTRAMUSCULAR | Status: DC | PRN
  Administered 2018-06-03: 2 mL

## 2018-06-03 MED ORDER — SODIUM CHLORIDE 0.9 % WEIGHT BASED INFUSION: 3.0000 mL/kg/h | INTRAVENOUS | Status: DC

## 2018-06-03 MED ORDER — NITROGLYCERIN 0.4 MG SL SUBL: 0.4000 mg | SUBLINGUAL_TABLET | SUBLINGUAL | 12 refills | Status: DC | PRN

## 2018-06-03 MED ORDER — FENTANYL CITRATE (PF) 100 MCG/2ML IJ SOLN
INTRAMUSCULAR | Status: AC
  Filled 2018-06-03: qty 2

## 2018-06-03 MED ORDER — HEPARIN (PORCINE) IN NACL 1000-0.9 UT/500ML-% IV SOLN
INTRAVENOUS | Status: AC
  Filled 2018-06-03: qty 1000

## 2018-06-03 MED ORDER — VERAPAMIL HCL 2.5 MG/ML IV SOLN
INTRAVENOUS | Status: DC | PRN
  Administered 2018-06-03: 10:00:00 via INTRA_ARTERIAL

## 2018-06-03 MED ORDER — MIDAZOLAM HCL 2 MG/2ML IJ SOLN
INTRAMUSCULAR | Status: AC
  Filled 2018-06-03: qty 2

## 2018-06-03 MED ORDER — HEPARIN SODIUM (PORCINE) 1000 UNIT/ML IJ SOLN
INTRAMUSCULAR | Status: DC | PRN
  Administered 2018-06-03: 10000 [IU] via INTRAVENOUS

## 2018-06-03 MED ORDER — SODIUM CHLORIDE 0.9 % WEIGHT BASED INFUSION
1.0000 mL/kg/h | INTRAVENOUS | Status: DC
  Administered 2018-06-03: 1 mL/kg/h via INTRAVENOUS

## 2018-06-03 MED ORDER — LABETALOL HCL 5 MG/ML IV SOLN: 10.0000 mg | INTRAVENOUS | Status: DC | PRN

## 2018-06-03 MED FILL — ASPIRIN LOW DOSE 81 MG TBEC: 81 | 90 days supply | Qty: 90 | Fill #0

## 2018-06-03 MED FILL — NITROGLYCERIN 0.4 MG TAB SL: 0.4 | 8 days supply | Qty: 25 | Fill #0

## 2018-06-03 MED FILL — ROSUVASTATIN CALCIUM 40 MG: 40 | 30 days supply | Qty: 30 | Fill #0

## 2018-06-03 MED FILL — METOPROLOL SUCCINATE ER 25: 25 | 30 days supply | Qty: 30 | Fill #0

## 2018-06-03 MED FILL — BRILINTA 90 MG TABLET: 90 | 30 days supply | Qty: 60 | Fill #0

## 2018-06-03 SURGICAL SUPPLY — 14 items
BALLN SAPPHIRE 2.0X15 (BALLOONS) ×2
BALLOON SAPPHIRE 2.0X15 (BALLOONS) ×1 IMPLANT
CATH VISTA GUIDE 6FR XBLAD3.5 (CATHETERS) ×2 IMPLANT
DEVICE RAD COMP TR BAND LRG (VASCULAR PRODUCTS) ×2 IMPLANT
GLIDESHEATH SLEND SS 6F .021 (SHEATH) ×2 IMPLANT
GUIDEWIRE INQWIRE 1.5J.035X260 (WIRE) ×1 IMPLANT
INQWIRE 1.5J .035X260CM (WIRE) ×2
KIT ENCORE 26 ADVANTAGE (KITS) ×2 IMPLANT
KIT HEART LEFT (KITS) ×2 IMPLANT
PACK CARDIAC CATHETERIZATION (CUSTOM PROCEDURE TRAY) ×2 IMPLANT
STENT RESOLUTE ONYX 2.25X18 (Permanent Stent) ×2 IMPLANT
TRANSDUCER W/STOPCOCK (MISCELLANEOUS) ×2 IMPLANT
TUBING CIL FLEX 10 FLL-RA (TUBING) ×2 IMPLANT
WIRE HI TORQ WHISPER MS 190CM (WIRE) ×2 IMPLANT

## 2018-06-03 NOTE — Discharge Summary (Signed)
Physician Discharge Summary  Joseph Vaughn XKG:[id number] DOB: Jun 08, 1956 DOA: 05/31/2018  PCP: Tonia Ghent, MD  Admit date: 05/31/2018 Discharge date: 06/03/2018  Admitted From: Home Disposition:  Home  Discharge Condition:Stable CODE STATUS:FULL Diet recommendation: Heart Healthy  Brief/Interim Summary: Patient is a 46-year male with past medical history of  left bundle branch block, tobacco abuse, hyperlipidemia, hypertension, bipolar disorder, depression,Tobacco usewho was sent from [facility] for further evaluation of his pain.There was concern of non-STEMI.Cardiology consulted. Started on heparin drip.Also found to have elevated troponins. Underwent staged cardiac cath. Patient medically stable for discharge today to home after cath.Found to have significant coronary artery disease.  Underwent drug-eluting stent.  Started on Brilinta. Patient will follow-up with cardiology as an outpatient.  Following problems were addressed during hospitalization:  Chest pain/NSTEMI: Presented with chest pain with elevated troponin.  Clinical presentation suggestive for NSTEMI.  Start on IV heparin.  Underwent  cardiac cath today.  Found to have significant coronary artery disease(proximal to mid LAD stenosis ,mid to distal AV groove circumflex disease).  Underwent drug-eluting stent placement.  Plan is to continue aspirin and Brilinta for minimum of 12 months. Cardiology following. Echocardiogram showed mild left ventricular hypertrophy, normal systolic function ejection fraction within the normal range of 55 to 60%, no regional wall motion abnormality, grade 1 diastolic dysfunction  Hyperlipidemia: On Crestor at home.  LDL of 166.  Hemoglobin A1c of 5.1.  Hypertension: Presented with hypertension.  Not on any agents at home. Started on metoprolol.  Currently blood pressure stable.  Left bundle branch block: Chronic  Right upper lobe nodules: Recommended nonemergent CT scan  of the chest as an outpatient.  Incidental finding on the chest x-ray.  Tobacco use: Counseled for cessation   Discharge Diagnoses:  Principal Problem:   Chest pain Active Problems:   Pure hypercholesterolemia   BBB (bundle branch block)   Statin intolerance   Bipolar affective disorder (HCC)   NSTEMI (non-ST elevated myocardial infarction) ([place])   Left bundle branch block   CKD (chronic kidney disease), stage II   Hyperlipidemia    Discharge Instructions  Discharge Instructions    Amb Referral to Cardiac Rehabilitation   Complete by:  As directed    Diagnosis:   NSTEMI Coronary Stents     Diet - low sodium heart healthy   Complete by:  As directed    Discharge instructions   Complete by:  As directed    1) Follow up with cardiology as an outpatient. 2)Take prescribed medications as instructed.   Increase activity slowly   Complete by:  As directed      Allergies as of 06/03/2018      Reactions   Lipitor [atorvastatin] Other (See Comments)   "Felt awful" on med      Medication List    TAKE these medications   aspirin 81 MG EC tablet Take 1 tablet (81 mg total) by mouth daily. Start taking on:  June 04, 2018   buPROPion 150 MG 12 hr tablet Commonly known as:  WELLBUTRIN SR Take 150 mg by mouth daily.   lamoTRIgine 150 MG tablet Commonly known as:  LAMICTAL Take 150 mg by mouth daily.   metoprolol succinate 25 MG 24 hr tablet Commonly known as:  TOPROL-XL Take 1 tablet (25 mg total) by mouth daily. Start taking on:  June 04, 2018   nitroGLYCERIN 0.4 MG SL tablet Commonly known as:  NITROSTAT Place 1 tablet (0.4 mg total) under the tongue every 5 (five)  minutes as needed for chest pain (CP or SOB).   rosuvastatin 40 MG tablet Commonly known as:  CRESTOR Take 1 tablet (40 mg total) by mouth daily at 6 PM.   ticagrelor 90 MG Tabs tablet Commonly known as:  BRILINTA Take 1 tablet (90 mg total) by mouth 2 (two) times daily.       Follow-up Information    Josue Hector, MD. Go on 06/17/2018.   Specialty:  Cardiology Why:  @3 :30pm for hospital follow up. Please arrive 15 minutes early  Contact information: [address] [place]  [address] [phone]          Allergies  Allergen Reactions  . Lipitor [Atorvastatin] Other (See Comments)    "Felt awful" on med    Consultations:  Cardiology   Procedures/Studies: Dg Chest 2 View  Result Date: 05/31/2018 CLINICAL DATA:  Chest pain EXAM: CHEST - 2 VIEW COMPARISON:  None. FINDINGS: The lungs are clear without focal pneumonia, edema, pneumothorax or pleural effusion. Tiny nodules identified right upper lobe. The cardiopericardial silhouette is within normal limits for size. The visualized bony structures of the thorax are intact. Telemetry leads overlie the chest. IMPRESSION: Tiny right upper lobe pulmonary nodules, likely granulomata. CT chest without contrast could be used to confirm. Otherwise unremarkable. Electronically Signed   By: Misty Stanley M.D.   On: 05/31/2018 17:21      Subjective: Patient seen and examined.  At the bedside this morning.  Denies any chest pain.  Undergoing for stage cardiac cath again today. Checked with cardiology later this afternoon.  He is cleared for discharge.  Patient ambulated on the hallway after the cath and he did well on that.  He will follow-up with cardiology as an outpatient.  Discharge Exam: Vitals:   06/03/18 1104 06/03/18 1454  BP: 134/72 117/70  Pulse: 64 (!) 56  Resp: 19 10  Temp: 97.7 F (36.5 C)   SpO2: 92% 93%   Vitals:   06/03/18 1028 06/03/18 1030 06/03/18 1104 06/03/18 1454  BP: 139/84 135/86 134/72 117/70  Pulse: 71 (!) 139 64 (!) 56  Resp: (!) 9 16 19 10   Temp:   97.7 F (36.5 C)   TempSrc:   Oral   SpO2: 97% 97% 92% 93%  Weight:      Height:        General: Pt is alert, awake, not in acute distress Cardiovascular: RRR, S1/S2 +, no rubs, no  gallops Respiratory: CTA bilaterally, no wheezing, no rhonchi Abdominal: Soft, NT, ND, bowel sounds + Extremities: no edema, no cyanosis    The results of significant diagnostics from this hospitalization (including imaging, microbiology, ancillary and laboratory) are listed below for reference.     Microbiology: No results found for this or any previous visit (from the past 240 hour(s)).   Labs: BNP (last 3 results) No results for input(s): BNP in the last 8760 hours. Basic Metabolic Panel: Recent Labs  Lab 05/31/18 1648 05/31/18 1652 06/02/18 0323 06/03/18 0411  NA 139 139 140 138  K 3.9 4.0 4.2 4.5  CL 104 102 102 102  CO2 28  --  25 27  GLUCOSE 112* 107* 98 96  BUN 12 11 14 13   CREATININE 1.45* 1.50* 1.36* 1.50*  CALCIUM 9.0  --  9.1 9.4   Liver Function Tests: Recent Labs  Lab 05/31/18 1648  AST 24  ALT 24  ALKPHOS 73  BILITOT 0.4  PROT 7.0  ALBUMIN 3.8   No  results for input(s): LIPASE, AMYLASE in the last 168 hours. No results for input(s): AMMONIA in the last 168 hours. CBC: Recent Labs  Lab 05/31/18 1648 05/31/18 1652 06/02/18 0323 06/03/18 0411  WBC 9.7  --  10.2 11.0*  NEUTROABS 6.1  --   --   --   HGB 14.9 15.3 15.5 15.6  HCT 45.3 45.0 46.4 46.8  MCV 94.2  --  93.4 91.6  PLT 216  --  238 213   Cardiac Enzymes: Recent Labs  Lab 05/31/18 1844 05/31/18 2148 06/01/18 0028 06/01/18 0159  TROPONINI <0.03 0.23* 0.35* 0.55*   BNP: Invalid input(s): POCBNP CBG: No results for input(s): GLUCAP in the last 168 hours. D-Dimer No results for input(s): DDIMER in the last 72 hours. Hgb A1c Recent Labs    06/01/18 1222  HGBA1C 5.1   Lipid Profile Recent Labs    06/02/18 0323  CHOL 249*  HDL 30*  LDLCALC 166*  TRIG 263*  CHOLHDL 8.3   Thyroid function studies No results for input(s): TSH, T4TOTAL, T3FREE, THYROIDAB in the last 72 hours.  Invalid input(s): FREET3 Anemia work up No results for input(s): VITAMINB12, FOLATE,  FERRITIN, TIBC, IRON, RETICCTPCT in the last 72 hours. Urinalysis No results found for: COLORURINE, APPEARANCEUR, LABSPEC, [place], GLUCOSEU, HGBUR, BILIRUBINUR, KETONESUR, PROTEINUR, UROBILINOGEN, NITRITE, LEUKOCYTESUR Sepsis Labs Invalid input(s): PROCALCITONIN,  WBC,  LACTICIDVEN Microbiology No results found for this or any previous visit (from the past 240 hour(s)).  Please note: You were cared for by a hospitalist during your hospital stay. Once you are discharged, your primary care physician will handle any further medical issues. Please note that NO REFILLS for any discharge medications will be authorized once you are discharged, as it is imperative that you return to your primary care physician (or establish a relationship with a primary care physician if you do not have one) for your post hospital discharge needs so that they can reassess your need for medications and monitor your lab values.    Time coordinating discharge: 40 minutes  SIGNED:   Shelly Coss, MD  [facility] 06/03/2018, 3:28 PM Pager [phone]  If 7PM-7AM, please contact night-coverage [email or web address] Password [email or web address]

## 2018-06-03 NOTE — Progress Notes (Signed)
[place] for Heparin Indication: chest pain/ACS  Allergies  Allergen Reactions  . Lipitor [Atorvastatin] Other (See Comments)    "Felt awful" on med    Patient Measurements: Height: 5\' 5"  (165.1 cm) Weight: 168 lb 14.4 oz (76.6 kg) IBW/kg (Calculated) : 61.5 kg HEPARIN DW (KG): 77.1 kg   Vital Signs: Temp: 98.1 F (36.7 C) (12/24 0718) Temp Source: Oral (12/24 0718) BP: 144/84 (12/24 0718) Pulse Rate: 64 (12/24 0718)  Labs: Recent Labs    05/31/18 1648 05/31/18 1652  05/31/18 2148 06/01/18 0028 06/01/18 0159 06/01/18 1030 06/02/18 0323 06/03/18 0411  HGB 14.9 15.3  --   --   --   --   --  15.5 15.6  HCT 45.3 45.0  --   --   --   --   --  46.4 46.8  PLT 216  --   --   --   --   --   --  238 213  APTT  --   --   --   --   --  29  --   --   --   LABPROT  --   --   --   --   --  13.2  --   --   --   INR  --   --   --   --   --  1.01  --   --   --   HEPARINUNFRC  --   --   --   --   --   --  0.30 0.43 0.38  CREATININE 1.45* 1.50*  --   --   --   --   --  1.36* 1.50*  TROPONINI  --   --    < > 0.23* 0.35* 0.55*  --   --   --    < > = values in this interval not displayed.   Estimated Creatinine Clearance: 49.4 mL/min (A) (by C-G formula based on SCr of 1.5 mg/dL (H)).   Assessment: 62 yo male on heparin for NSTEMI. No AC PTA. Heparin level 0.38 on gtt at 1000 units/hr. Baseline CBC wnl. No bleeding noted.   Heparin level at goal this morning, cbc stable overnight.   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 Successful PCI to LAD, patient still needs staged PCI to Cfx today.   Goal of Therapy:  Heparin level 0.3-0.7 units/ml   Plan:  Continue heparin at 1000 units/hr  Check heparin level and CBC daily  Erin Hearing PharmD., BCPS Clinical Pharmacist 06/03/2018 8:29 AM  **Pharmacist phone directory can now be found on [email or web address] (PW [email or web address]).  Listed under [place]. 06/03/2018,8:29 AM

## 2018-06-03 NOTE — Progress Notes (Signed)
CARDIAC REHAB PHASE I   Offered to walk with pt, pt declining at this time. Helped pt change gown. Pt educated on importance of ASA, Brilinta, statin, and NTG. Stent card and MI book at bedside. Pt given heart healthy diet and smoking cessation tip sheet. Reviewed restrictions and exercise guidelines. Pt dismissive with much of the education. RN made aware. Will refer to CRP II [place]. Pt for another procedure later this afternoon.  [id number] Rufina Falco, RN BSN 06/03/2018 8:35 AM

## 2018-06-03 NOTE — Interval H&P Note (Signed)
History and Physical Interval Note:  06/03/2018 9:34 AM  Joseph Vaughn  has presented today for surgery, with the diagnosis of nstemi. The various methods of treatment have been discussed with the patient and family. After consideration of risks, benefits and other options for treatment, the patient has consented to  Procedure(s): CORONARY STENT INTERVENTION (N/A) as a surgical intervention .  The patient's history has been reviewed, patient examined, no change in status, stable for surgery.  I have reviewed the patient's chart and labs.  Questions were answered to the patient's satisfaction.    Cath Lab Visit (complete for each Cath Lab visit)  Clinical Evaluation Leading to the Procedure:   ACS: Yes.    Non-ACS:    Anginal Classification: CCS III  Anti-ischemic medical therapy: Minimal Therapy (1 class of medications)  Non-Invasive Test Results: No non-invasive testing performed  Prior CABG: No previous CABG         Joseph Vaughn

## 2018-06-03 NOTE — H&P (View-Only) (Signed)
Progress Note  Patient Name: Joseph Vaughn Date of Encounter: 06/03/2018  Primary Cardiologist: Jenkins Rouge, MD   Subjective   No recurrent chest pain. Had TR band irritation yesterday, recommended gauge at edges today. Dyspnea after Brillinta yesterday. Monitor symptoms today.   Inpatient Medications    Scheduled Meds: . aspirin EC  81 mg Oral Daily  . buPROPion  150 mg Oral Daily  . lamoTRIgine  150 mg Oral Daily  . metoprolol succinate  25 mg Oral Daily  . rosuvastatin  40 mg Oral q1800  . sodium chloride flush  3 mL Intravenous Q12H  . sodium chloride flush  3 mL Intravenous Q12H  . ticagrelor  90 mg Oral BID   Continuous Infusions: . sodium chloride    . sodium chloride    . sodium chloride 1 mL/kg/hr (06/03/18 0700)  . heparin 1,000 Units/hr (06/03/18 0700)   PRN Meds: sodium chloride, sodium chloride, acetaminophen, morphine injection, nitroGLYCERIN, ondansetron (ZOFRAN) IV, sodium chloride flush, sodium chloride flush   Vital Signs    Vitals:   06/03/18 0000 06/03/18 0444 06/03/18 0545 06/03/18 0718  BP:  (!) 152/81 (!) 152/81 (!) 144/84  Pulse: 60 (!) 59 63 64  Resp: 11 11 15 14   Temp:   98 F (36.7 C) 98.1 F (36.7 C)  TempSrc:   Oral Oral  SpO2: 93% 96% 94% 96%  Weight:   76.6 kg   Height:        Intake/Output Summary (Last 24 hours) at 06/03/2018 0816 Last data filed at 06/03/2018 0700 Gross per 24 hour  Intake 2103.93 ml  Output -  Net 2103.93 ml   Filed Weights   05/31/18 1612 06/01/18 0958 06/03/18 0545  Weight: 77.1 kg 77.6 kg 76.6 kg    Telemetry    SR at at low 60s, intermittent tachycardia at 100s - Personally Reviewed  ECG    Sinus rhythm at 60 bpm, LBBB - Personally Reviewed  Physical Exam   GEN: No acute distress.   Neck: No JVD Cardiac: RRR, no murmurs, rubs, or gallops. Right radial without hematoma.  Respiratory: Clear to auscultation bilaterally. GI: Soft, nontender, non-distended  MS: No edema; No  deformity. Neuro:  Nonfocal  Psych: Normal affect   Labs    Chemistry Recent Labs  Lab 05/31/18 1648 05/31/18 1652 06/02/18 0323 06/03/18 0411  NA 139 139 140 138  K 3.9 4.0 4.2 4.5  CL 104 102 102 102  CO2 28  --  25 27  GLUCOSE 112* 107* 98 96  BUN 12 11 14 13   CREATININE 1.45* 1.50* 1.36* 1.50*  CALCIUM 9.0  --  9.1 9.4  PROT 7.0  --   --   --   ALBUMIN 3.8  --   --   --   AST 24  --   --   --   ALT 24  --   --   --   ALKPHOS 73  --   --   --   BILITOT 0.4  --   --   --   GFRNONAA 52*  --  56* 50*  GFRAA 60*  --  >60 57*  ANIONGAP 7  --  13 9     Hematology Recent Labs  Lab 05/31/18 1648 05/31/18 1652 06/02/18 0323 06/03/18 0411  WBC 9.7  --  10.2 11.0*  RBC 4.81  --  4.97 5.11  HGB 14.9 15.3 15.5 15.6  HCT 45.3 45.0 46.4 46.8  MCV 94.2  --  93.4 91.6  MCH 31.0  --  31.2 30.5  MCHC 32.9  --  33.4 33.3  RDW 12.3  --  12.2 11.9  PLT 216  --  238 213    Cardiac Enzymes Recent Labs  Lab 05/31/18 1844 05/31/18 2148 06/01/18 0028 06/01/18 0159  TROPONINI <0.03 0.23* 0.35* 0.55*    Recent Labs  Lab 05/31/18 1650  TROPIPOC 0.00    Radiology    No results found.  Cardiac Studies   CORONARY STENT INTERVENTION  INTRAVASCULAR PRESSURE WIRE/FFR STUDY  LEFT HEART CATH AND CORONARY ANGIOGRAPHY  Conclusion     Prox LAD to Mid LAD lesion is 60% stenosed.  Dist Cx lesion is 80% stenosed.  A drug-eluting stent was successfully placed.  Post intervention, there is a 0% residual stenosis.   IMPRESSION: Successful FFR guided mid LAD PCI and drug-eluting stenting using a resolute Onyx drug-eluting stent postdilated to 2.3 mm.  He has residual mid to distal AV groove circumflex disease and an acute takeoff circumflex.  This will pose technical challenges.  The sheath was removed and a TR band was placed on the right wrist to achieve patent hemostasis.  The patient left the lab in stable condition.  Angiomax will continue for 4 hours full dose after  which IV heparin will be restarted per pharmacy.    Echo 06/01/18 Study Conclusions  - Left ventricle: The cavity size was normal. Wall thickness was increased in a pattern of mild LVH. Systolic function was normal. The estimated ejection fraction was in the range of 55% to 60%. Wall motion was normal; there were no regional wall motion abnormalities. Doppler parameters are consistent with abnormal left ventricular relaxation (grade 1 diastolic dysfunction). - Atrial septum: No defect or patent foramen ovale was identified. Diagnostic  Dominance: Right    Intervention       Patient Profile     Joseph Ferrick Swangeris a 62 y.o.malewith a hx of LBBB, tobacco abuse,HLDw/ statin intolerance and HTN,as well as h/o bipolar disorder and depreessionseen for NSTEMI.   Assessment & Plan    1. NSTEMI - Presented with classic anginal symptoms. Troponin less than 0.03>>0.23>>0.35>>0.55>> then stop. On Heparin. For cath today. Echo showed LVEF of 55-60% and grade 1 DD. Cath showed 60% proximal to mid LAD stenosis s/p successful FFR guided mid LAD PCI and drug-eluting stenting using a resolute Onyx drug-eluting stent postdilated to 2.3 mm.  He has residual mid to distal AV groove circumflex disease and an acute takeoff circumflex. Plan for staged PCI today. Has dyspnea post Brillinta last night.  Will give his AM dose and monitor his symptoms. Renal function minimally increased. Continue ASA, Brillinta, statin and BB.   2. HLD - 06/02/2018: Cholesterol 249; HDL 30; LDL Cholesterol 166; Triglycerides 263; VLDL 53  - Continue Crestor 40mg  daily.  Recheck labs in 6 weeks.   3. AKI with presume CKD stage III - Scr increased to 1.5 today from 1.35 yesterday. However, his Scr was 1.45 on admit. Follow closely post cath.       SignedCrista Luria Freeland, PA  06/03/2018, 8:16 AM    I have seen, examined and evaluated the patient this AM along with Mr. Curly [place].  After reviewing all  the available data and chart, we discussed the patients laboratory, study & physical findings as well as symptoms in detail. I agree with his findings, examination as well as impression recommendations as per our discussion.    Status post PCI to LAD yesterday.  Hope for staged PCI of the circumflex.  Difficult procedure due to acute angulation of the circumflex takeoff.  He is roughly at his baseline creatinine, will hydrate precath. On statin.  If blood pressure continues to be elevated, would consider adding amlodipine.    Glenetta Hew, M.D., M.S. Interventional Cardiologist   Pager # [phone] Phone # [phone] [address] [place], [place] [address]  For questions or updates, please contact [place] Please consult [email or web address] for contact info under

## 2018-06-03 NOTE — Progress Notes (Addendum)
Progress Note  Patient Name: Joseph Vaughn Date of Encounter: 06/03/2018  Primary Cardiologist: Jenkins Rouge, MD   Subjective   No recurrent chest pain. Had TR band irritation yesterday, recommended gauge at edges today. Dyspnea after Brillinta yesterday. Monitor symptoms today.   Inpatient Medications    Scheduled Meds: . aspirin EC  81 mg Oral Daily  . buPROPion  150 mg Oral Daily  . lamoTRIgine  150 mg Oral Daily  . metoprolol succinate  25 mg Oral Daily  . rosuvastatin  40 mg Oral q1800  . sodium chloride flush  3 mL Intravenous Q12H  . sodium chloride flush  3 mL Intravenous Q12H  . ticagrelor  90 mg Oral BID   Continuous Infusions: . sodium chloride    . sodium chloride    . sodium chloride 1 mL/kg/hr (06/03/18 0700)  . heparin 1,000 Units/hr (06/03/18 0700)   PRN Meds: sodium chloride, sodium chloride, acetaminophen, morphine injection, nitroGLYCERIN, ondansetron (ZOFRAN) IV, sodium chloride flush, sodium chloride flush   Vital Signs    Vitals:   06/03/18 0000 06/03/18 0444 06/03/18 0545 06/03/18 0718  BP:  (!) 152/81 (!) 152/81 (!) 144/84  Pulse: 60 (!) 59 63 64  Resp: 11 11 15 14   Temp:   98 F (36.7 C) 98.1 F (36.7 C)  TempSrc:   Oral Oral  SpO2: 93% 96% 94% 96%  Weight:   76.6 kg   Height:        Intake/Output Summary (Last 24 hours) at 06/03/2018 0816 Last data filed at 06/03/2018 0700 Gross per 24 hour  Intake 2103.93 ml  Output -  Net 2103.93 ml   Filed Weights   05/31/18 1612 06/01/18 0958 06/03/18 0545  Weight: 77.1 kg 77.6 kg 76.6 kg    Telemetry    SR at at low 60s, intermittent tachycardia at 100s - Personally Reviewed  ECG    Sinus rhythm at 60 bpm, LBBB - Personally Reviewed  Physical Exam   GEN: No acute distress.   Neck: No JVD Cardiac: RRR, no murmurs, rubs, or gallops. Right radial without hematoma.  Respiratory: Clear to auscultation bilaterally. GI: Soft, nontender, non-distended  MS: No edema; No  deformity. Neuro:  Nonfocal  Psych: Normal affect   Labs    Chemistry Recent Labs  Lab 05/31/18 1648 05/31/18 1652 06/02/18 0323 06/03/18 0411  NA 139 139 140 138  K 3.9 4.0 4.2 4.5  CL 104 102 102 102  CO2 28  --  25 27  GLUCOSE 112* 107* 98 96  BUN 12 11 14 13   CREATININE 1.45* 1.50* 1.36* 1.50*  CALCIUM 9.0  --  9.1 9.4  PROT 7.0  --   --   --   ALBUMIN 3.8  --   --   --   AST 24  --   --   --   ALT 24  --   --   --   ALKPHOS 73  --   --   --   BILITOT 0.4  --   --   --   GFRNONAA 52*  --  56* 50*  GFRAA 60*  --  >60 57*  ANIONGAP 7  --  13 9     Hematology Recent Labs  Lab 05/31/18 1648 05/31/18 1652 06/02/18 0323 06/03/18 0411  WBC 9.7  --  10.2 11.0*  RBC 4.81  --  4.97 5.11  HGB 14.9 15.3 15.5 15.6  HCT 45.3 45.0 46.4 46.8  MCV 94.2  --  93.4 91.6  MCH 31.0  --  31.2 30.5  MCHC 32.9  --  33.4 33.3  RDW 12.3  --  12.2 11.9  PLT 216  --  238 213    Cardiac Enzymes Recent Labs  Lab 05/31/18 1844 05/31/18 2148 06/01/18 0028 06/01/18 0159  TROPONINI <0.03 0.23* 0.35* 0.55*    Recent Labs  Lab 05/31/18 1650  TROPIPOC 0.00    Radiology    No results found.  Cardiac Studies   CORONARY STENT INTERVENTION  INTRAVASCULAR PRESSURE WIRE/FFR STUDY  LEFT HEART CATH AND CORONARY ANGIOGRAPHY  Conclusion     Prox LAD to Mid LAD lesion is 60% stenosed.  Dist Cx lesion is 80% stenosed.  A drug-eluting stent was successfully placed.  Post intervention, there is a 0% residual stenosis.   IMPRESSION: Successful FFR guided mid LAD PCI and drug-eluting stenting using a resolute Onyx drug-eluting stent postdilated to 2.3 mm.  He has residual mid to distal AV groove circumflex disease and an acute takeoff circumflex.  This will pose technical challenges.  The sheath was removed and a TR band was placed on the right wrist to achieve patent hemostasis.  The patient left the lab in stable condition.  Angiomax will continue for 4 hours full dose after  which IV heparin will be restarted per pharmacy.    Echo 06/01/18 Study Conclusions  - Left ventricle: The cavity size was normal. Wall thickness was increased in a pattern of mild LVH. Systolic function was normal. The estimated ejection fraction was in the range of 55% to 60%. Wall motion was normal; there were no regional wall motion abnormalities. Doppler parameters are consistent with abnormal left ventricular relaxation (grade 1 diastolic dysfunction). - Atrial septum: No defect or patent foramen ovale was identified. Diagnostic  Dominance: Right    Intervention       Patient Profile     Joseph Vaughn a 62 y.o.malewith a hx of LBBB, tobacco abuse,HLDw/ statin intolerance and HTN,as well as h/o bipolar disorder and depreessionseen for NSTEMI.   Assessment & Plan    1. NSTEMI - Presented with classic anginal symptoms. Troponin less than 0.03>>0.23>>0.35>>0.55>> then stop. On Heparin. For cath today. Echo showed LVEF of 55-60% and grade 1 DD. Cath showed 60% proximal to mid LAD stenosis s/p successful FFR guided mid LAD PCI and drug-eluting stenting using a resolute Onyx drug-eluting stent postdilated to 2.3 mm.  He has residual mid to distal AV groove circumflex disease and an acute takeoff circumflex. Plan for staged PCI today. Has dyspnea post Brillinta last night.  Will give his AM dose and monitor his symptoms. Renal function minimally increased. Continue ASA, Brillinta, statin and BB.   2. HLD - 06/02/2018: Cholesterol 249; HDL 30; LDL Cholesterol 166; Triglycerides 263; VLDL 53  - Continue Crestor 40mg  daily.  Recheck labs in 6 weeks.   3. AKI with presume CKD stage III - Scr increased to 1.5 today from 1.35 yesterday. However, his Scr was 1.45 on admit. Follow closely post cath.       SignedCrista Luria [place], PA  06/03/2018, 8:16 AM    I have seen, examined and evaluated the patient this AM along with Mr. Curly Sh[place].  After reviewing all  the available data and chart, we discussed the patients laboratory, study & physical findings as well as symptoms in detail. I agree with his findings, examination as well as impression recommendations as per our discussion.    Status post PCI to LAD yesterday.  Hope for staged PCI of the circumflex.  Difficult procedure due to acute angulation of the circumflex takeoff.  He is roughly at his baseline creatinine, will hydrate precath. On statin.  If blood pressure continues to be elevated, would consider adding amlodipine.    Glenetta Hew, M.D., M.S. Interventional Cardiologist   Pager # [phone] Phone # [phone] [address] [place], [place] [address]  For questions or updates, please contact [place] Please consult [email or web address] for contact info under

## 2018-06-05 MED FILL — Nitroglycerin IV Soln 100 MCG/ML in D5W: INTRA_ARTERIAL | Qty: 10 | Status: AC

## 2018-06-11 ENCOUNTER — Telehealth: Payer: Self-pay | Admitting: Family Medicine

## 2018-06-11 DIAGNOSIS — R918 Other nonspecific abnormal finding of lung field: Secondary | ICD-10-CM

## 2018-06-11 NOTE — Telephone Encounter (Signed)
Please call patient.  I saw the inpatient records.  Please let me know how he is feeling.  He should have follow-up with cardiology pending.  I will await those notes.  He had a chest x-ray with incidentally noted tiny pulmonary nodules.  He needs a follow-up chest CT to further evaluate those spots.  I put in the order.  The referral coordinators should be in contact with the patient about this in the next week or two.  This is not emergent.  Thanks.

## 2018-06-12 NOTE — Telephone Encounter (Signed)
Patient advised.
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
## 2018-06-17 ENCOUNTER — Ambulatory Visit (INDEPENDENT_AMBULATORY_CARE_PROVIDER_SITE_OTHER)
Admission: RE | Admit: 2018-06-17 | Discharge: 2018-06-17 | Disposition: A | Discharge status: Home / Self Care | Payer: BLUE CROSS/BLUE SHIELD | Source: Ambulatory Visit | Attending: Family Medicine | Admitting: Family Medicine

## 2018-06-17 ENCOUNTER — Ambulatory Visit: Payer: BLUE CROSS/BLUE SHIELD | Admitting: Cardiovascular Disease

## 2018-06-17 ENCOUNTER — Encounter: Payer: Self-pay | Admitting: Cardiovascular Disease

## 2018-06-17 VITALS — BP 114/70 | HR 62 | Ht 65.0 in | Wt 170.0 lb

## 2018-06-17 DIAGNOSIS — I447 Left bundle-branch block, unspecified: Secondary | ICD-10-CM

## 2018-06-17 DIAGNOSIS — I2510 Atherosclerotic heart disease of native coronary artery without angina pectoris: Secondary | ICD-10-CM

## 2018-06-17 DIAGNOSIS — E785 Hyperlipidemia, unspecified: Secondary | ICD-10-CM

## 2018-06-17 DIAGNOSIS — R918 Other nonspecific abnormal finding of lung field: Secondary | ICD-10-CM

## 2018-06-17 NOTE — Patient Instructions (Addendum)

## 2018-06-25 ENCOUNTER — Telehealth: Payer: Self-pay | Admitting: Cardiovascular Disease

## 2018-06-25 NOTE — Telephone Encounter (Signed)
Spoke with patient who c/o nausea and dizziness after taking morning medications the past 2 mornings. Patient states he thinks the symptoms are related to Brilinta but he recently started Aspirin, Brilinta, Toprol, and Crestor during hospitalization on 12/23. 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 He also c/o dyspnea and is taking the Brilinta with caffeine as advised by one of our providers. We discussed his medication schedule and diet and I advised that he may not be eating enough during the day which may contribute to nausea. He states he has had decreased appetite since his MI. He states he notices more minor symptoms in the evening when he takes 2nd dose of Brilinta. I advised that we should not make multiple medication changes at the same time and I will forward to Dr. Johnsie Cancel for advice. I advised him to continue Brilinta until he hears back from [place] and he verbalized agreement. He thanked me for my help.

## 2018-06-25 NOTE — Telephone Encounter (Signed)
New Message:     Patient calling concerning some medication that he has is making him sick. Please call patient.

## 2018-06-25 NOTE — Telephone Encounter (Signed)
Called patient and reviewed Dr. Kyla Balzarine advice. He states he would like to try taking Brilinta later in the morning which was something I suggested in our previous conversation. He will take Toprol XL 25 mg early in the morning with breakfast with aspirin and then will take Brilinta around 1000. I advised him to call back to report if symptoms do not improve and we will send Rx for Effient. He verbalized understanding and agreement with plan and thanked me for the call.

## 2018-06-25 NOTE — Telephone Encounter (Signed)
If Brillinta clearly causing dyspnea and symptoms can change to Effient ( 60 mg loading dose then 10 mg/day) If this is too expensive can Load with 600 mg plavix and then 75 mg/day

## 2018-07-03 ENCOUNTER — Other Ambulatory Visit: Payer: Self-pay

## 2018-07-30 DIAGNOSIS — H9202 Otalgia, left ear: Secondary | ICD-10-CM | POA: Diagnosis not present

## 2018-08-12 ENCOUNTER — Other Ambulatory Visit: Payer: Self-pay

## 2018-08-12 ENCOUNTER — Inpatient Hospital Stay (HOSPITAL_COMMUNITY)
Admission: AD | Admit: 2018-08-12 | Discharge: 2018-08-13 | DRG: 287 | Disposition: A | Discharge status: Home / Self Care | Payer: BLUE CROSS/BLUE SHIELD | Source: Ambulatory Visit | Attending: Cardiovascular Disease | Admitting: Cardiovascular Disease

## 2018-08-12 ENCOUNTER — Encounter: Payer: Self-pay | Admitting: Cardiology

## 2018-08-12 ENCOUNTER — Telehealth: Payer: Self-pay | Admitting: Cardiovascular Disease

## 2018-08-12 ENCOUNTER — Ambulatory Visit: Payer: BLUE CROSS/BLUE SHIELD | Admitting: Cardiology

## 2018-08-12 ENCOUNTER — Encounter (HOSPITAL_COMMUNITY): Payer: Self-pay | Admitting: *Deleted

## 2018-08-12 VITALS — BP 124/62 | HR 65 | Ht 65.0 in | Wt 165.0 lb

## 2018-08-12 DIAGNOSIS — I252 Old myocardial infarction: Secondary | ICD-10-CM | POA: Diagnosis not present

## 2018-08-12 DIAGNOSIS — Z79899 Other long term (current) drug therapy: Secondary | ICD-10-CM

## 2018-08-12 DIAGNOSIS — Z87442 Personal history of urinary calculi: Secondary | ICD-10-CM | POA: Diagnosis not present

## 2018-08-12 DIAGNOSIS — F17210 Nicotine dependence, cigarettes, uncomplicated: Secondary | ICD-10-CM | POA: Diagnosis not present

## 2018-08-12 DIAGNOSIS — I447 Left bundle-branch block, unspecified: Secondary | ICD-10-CM | POA: Diagnosis not present

## 2018-08-12 DIAGNOSIS — I2510 Atherosclerotic heart disease of native coronary artery without angina pectoris: Secondary | ICD-10-CM | POA: Diagnosis present

## 2018-08-12 DIAGNOSIS — Z833 Family history of diabetes mellitus: Secondary | ICD-10-CM | POA: Diagnosis not present

## 2018-08-12 DIAGNOSIS — R0789 Other chest pain: Secondary | ICD-10-CM | POA: Diagnosis not present

## 2018-08-12 DIAGNOSIS — I25110 Atherosclerotic heart disease of native coronary artery with unstable angina pectoris: Principal | ICD-10-CM | POA: Diagnosis present

## 2018-08-12 DIAGNOSIS — H5350 Unspecified color vision deficiencies: Secondary | ICD-10-CM | POA: Diagnosis present

## 2018-08-12 DIAGNOSIS — I200 Unstable angina: Secondary | ICD-10-CM | POA: Insufficient documentation

## 2018-08-12 DIAGNOSIS — E7800 Pure hypercholesterolemia, unspecified: Secondary | ICD-10-CM | POA: Diagnosis not present

## 2018-08-12 DIAGNOSIS — Z9889 Other specified postprocedural states: Secondary | ICD-10-CM

## 2018-08-12 DIAGNOSIS — F319 Bipolar disorder, unspecified: Secondary | ICD-10-CM | POA: Diagnosis present

## 2018-08-12 DIAGNOSIS — Z7982 Long term (current) use of aspirin: Secondary | ICD-10-CM | POA: Diagnosis not present

## 2018-08-12 DIAGNOSIS — R079 Chest pain, unspecified: Secondary | ICD-10-CM | POA: Diagnosis present

## 2018-08-12 DIAGNOSIS — Z809 Family history of malignant neoplasm, unspecified: Secondary | ICD-10-CM

## 2018-08-12 DIAGNOSIS — Z955 Presence of coronary angioplasty implant and graft: Secondary | ICD-10-CM

## 2018-08-12 DIAGNOSIS — E785 Hyperlipidemia, unspecified: Secondary | ICD-10-CM | POA: Diagnosis not present

## 2018-08-12 DIAGNOSIS — E782 Mixed hyperlipidemia: Secondary | ICD-10-CM | POA: Diagnosis not present

## 2018-08-12 DIAGNOSIS — Z789 Other specified health status: Secondary | ICD-10-CM | POA: Diagnosis present

## 2018-08-12 HISTORY — DX: Other specified postprocedural states: Z98.890

## 2018-08-12 HISTORY — DX: Atherosclerotic heart disease of native coronary artery without angina pectoris: I25.10

## 2018-08-12 HISTORY — DX: Chronic kidney disease, stage 2 (mild): N18.2

## 2018-08-12 HISTORY — DX: Acute myocardial infarction, unspecified: I21.9

## 2018-08-12 HISTORY — DX: Other chest pain: R07.89

## 2018-08-12 LAB — COMPREHENSIVE METABOLIC PANEL
ALT: 29 U/L (ref 0–44)
AST: 22 U/L (ref 15–41)
Albumin: 3.6 g/dL (ref 3.5–5.0)
Alkaline Phosphatase: 74 U/L (ref 38–126)
Anion gap: 7 (ref 5–15)
BUN: 10 mg/dL (ref 8–23)
CO2: 26 mmol/L (ref 22–32)
Calcium: 8.6 mg/dL — ABNORMAL LOW (ref 8.9–10.3)
Chloride: 103 mmol/L (ref 98–111)
Creatinine, Ser: 1.33 mg/dL — ABNORMAL HIGH (ref 0.61–1.24)
GFR calc Af Amer: >60 mL/min (ref >60)
GFR calc non Af Amer: 57 mL/min — ABNORMAL LOW (ref >60)
Glucose, Bld: 77 mg/dL (ref 70–99)
Potassium: 3.5 mmol/L (ref 3.5–5.1)
Sodium: 136 mmol/L (ref 135–145)
Total Bilirubin: 0.2 mg/dL — ABNORMAL LOW (ref 0.3–1.2)
Total Protein: 6.5 g/dL (ref 6.5–8.1)

## 2018-08-12 LAB — CBC
HCT: 38.4 % — ABNORMAL LOW (ref 39.0–52.0)
Hemoglobin: 13 g/dL (ref 13.0–17.0)
MCH: 30.5 pg (ref 26.0–34.0)
MCHC: 33.9 g/dL (ref 30.0–36.0)
MCV: 90.1 fL (ref 80.0–100.0)
Platelets: 215 10*3/uL (ref 150–400)
RBC: 4.26 MIL/uL (ref 4.22–5.81)
RDW: 11.9 % (ref 11.5–15.5)
WBC: 9.2 10*3/uL (ref 4.0–10.5)
nRBC: 0 % (ref 0.0–0.2)

## 2018-08-12 LAB — BRAIN NATRIURETIC PEPTIDE: B Natriuretic Peptide: 15.1 pg/mL (ref 0.0–100.0)

## 2018-08-12 LAB — TROPONIN I: Troponin I: <0.03 ng/mL (ref <0.03)

## 2018-08-12 MED ORDER — ROSUVASTATIN CALCIUM 20 MG PO TABS
40.0000 mg | ORAL_TABLET | Freq: Every day | ORAL | Status: DC
  Administered 2018-08-12 – 2018-08-13 (×2): 40 mg via ORAL
  Filled 2018-08-12 (×2): qty 2

## 2018-08-12 MED ORDER — LAMOTRIGINE 150 MG PO TABS
150.0000 mg | ORAL_TABLET | Freq: Every day | ORAL | Status: DC
  Administered 2018-08-13: 150 mg via ORAL
  Filled 2018-08-12: qty 1

## 2018-08-12 MED ORDER — METOPROLOL SUCCINATE ER 25 MG PO TB24
25.0000 mg | ORAL_TABLET | Freq: Every day | ORAL | Status: DC
  Administered 2018-08-13: 25 mg via ORAL
  Filled 2018-08-12: qty 1

## 2018-08-12 MED ORDER — ASPIRIN EC 81 MG PO TBEC
81.0000 mg | DELAYED_RELEASE_TABLET | Freq: Every day | ORAL | Status: DC
  Administered 2018-08-13: 81 mg via ORAL
  Filled 2018-08-12: qty 1

## 2018-08-12 MED ORDER — NITROGLYCERIN 0.4 MG SL SUBL: 0.4000 mg | SUBLINGUAL_TABLET | SUBLINGUAL | Status: DC | PRN

## 2018-08-12 MED ORDER — BUPROPION HCL ER (SR) 150 MG PO TB12
150.0000 mg | ORAL_TABLET | Freq: Every day | ORAL | Status: DC
  Administered 2018-08-13: 150 mg via ORAL
  Filled 2018-08-12: qty 1

## 2018-08-12 MED ORDER — TICAGRELOR 90 MG PO TABS
90.0000 mg | ORAL_TABLET | Freq: Two times a day (BID) | ORAL | Status: DC
  Administered 2018-08-12 – 2018-08-13 (×2): 90 mg via ORAL
  Filled 2018-08-12 (×2): qty 1

## 2018-08-12 MED ORDER — ACETAMINOPHEN 500 MG PO TABS: 1000.0000 mg | ORAL_TABLET | Freq: Four times a day (QID) | ORAL | Status: DC | PRN

## 2018-08-12 NOTE — Telephone Encounter (Signed)
New Message   Patient states he thinks he had a heart attack last night and didn't go to the ER he just to nitroclycerin and aspirin and he's not having any issues at the moment he just wanted an appointment for today but there is no openings so I put him on the schedule for 08/13/18 @ 10:45am.

## 2018-08-12 NOTE — Patient Instructions (Signed)
Medication Instructions:  Your physician recommends that you continue on your current medications as directed. Please refer to the Current Medication list given to you today.  * If you need a refill on your cardiac medications before your next appointment, please call your pharmacy.   Labwork: None ordered  Testing/Procedures: None ordered  Follow-Up: Go to the hospital for admission  Thank you for choosing [facility]!!

## 2018-08-12 NOTE — Plan of Care (Signed)
Problem: Education: Goal: Knowledge of General Education information will improve Description Including pain rating scale, medication(s)/side effects and non-pharmacologic comfort measures Outcome: Progressing   Problem: Health Behavior/Discharge Planning: Goal: Ability to manage health-related needs will improve Outcome: Progressing   Problem: Clinical Measurements: Goal: Diagnostic test results will improve Outcome: Progressing   Problem: Coping: Goal: Level of anxiety will decrease Outcome: Progressing   Problem: Pain Managment: Goal: General experience of comfort will improve Outcome: Progressing   Problem: Safety: Goal: Ability to remain free from injury will improve Outcome: Progressing   
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
## 2018-08-12 NOTE — Telephone Encounter (Signed)
I spoke with Joseph Vaughn. He reports while sitting upstairs yesterday after work he developed chest pain around 8:30 PM. He states this pain was just like he had with his heart attack.  He describes pain as being in the same point in his chest as with heart attack with pressure into arms,head and shoulders. He took 3 NTG over about a 30 minute period.  The 3rd NTG helped a little and he eventually was able to go to sleep.  Entire episode lasted 2.5-3 hours. Felt extremely tired during episode. Today he is not having any pain or pressure. Still feels tired but not as bad as last night. He has been scheduled to see Dr. Johnsie Cancel tomorrow at 10:45.  I advised him to go to ED if any symptoms prior to this appointment. I told Joseph Vaughn I would make Dr Johnsie Cancel aware and we would call him back if any new recommendations.

## 2018-08-12 NOTE — Telephone Encounter (Signed)
Reviewed with Dr. Johnsie Cancel and he would like pt seen in office today by DOD. I reviewed with Dr. Curt Bears and he can see pt today at 10:15 or 3:45.  I spoke with pt and he is unable to be here at 10:15 so will come in for 3:45 appointment. Pt is aware to go to ED if any symptoms prior to appointment today. I will cancel appointment for tomorrow with Dr. Johnsie Cancel.
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
## 2018-08-13 ENCOUNTER — Ambulatory Visit: Payer: Self-pay | Admitting: Cardiovascular Disease

## 2018-08-13 ENCOUNTER — Encounter (HOSPITAL_COMMUNITY): Payer: Self-pay | Admitting: General Practice

## 2018-08-13 ENCOUNTER — Encounter (HOSPITAL_COMMUNITY)
Admission: AD | Disposition: A | Discharge status: Home / Self Care | Payer: Self-pay | Source: Ambulatory Visit | Attending: Cardiovascular Disease

## 2018-08-13 DIAGNOSIS — E782 Mixed hyperlipidemia: Secondary | ICD-10-CM

## 2018-08-13 DIAGNOSIS — E7800 Pure hypercholesterolemia, unspecified: Secondary | ICD-10-CM

## 2018-08-13 DIAGNOSIS — R079 Chest pain, unspecified: Secondary | ICD-10-CM

## 2018-08-13 DIAGNOSIS — I447 Left bundle-branch block, unspecified: Secondary | ICD-10-CM

## 2018-08-13 DIAGNOSIS — R0789 Other chest pain: Secondary | ICD-10-CM

## 2018-08-13 DIAGNOSIS — Z9889 Other specified postprocedural states: Secondary | ICD-10-CM

## 2018-08-13 DIAGNOSIS — I25110 Atherosclerotic heart disease of native coronary artery with unstable angina pectoris: Principal | ICD-10-CM

## 2018-08-13 DIAGNOSIS — I2510 Atherosclerotic heart disease of native coronary artery without angina pectoris: Secondary | ICD-10-CM

## 2018-08-13 HISTORY — DX: Chest pain, unspecified: R07.9

## 2018-08-13 HISTORY — DX: Other specified postprocedural states: Z98.890

## 2018-08-13 HISTORY — PX: LEFT HEART CATH AND CORONARY ANGIOGRAPHY: CATH118249

## 2018-08-13 LAB — BASIC METABOLIC PANEL
Anion gap: 8 (ref 5–15)
BUN: 10 mg/dL (ref 8–23)
CO2: 26 mmol/L (ref 22–32)
Calcium: 9 mg/dL (ref 8.9–10.3)
Chloride: 106 mmol/L (ref 98–111)
Creatinine, Ser: 1.36 mg/dL — ABNORMAL HIGH (ref 0.61–1.24)
GFR calc Af Amer: >60 mL/min (ref >60)
GFR calc non Af Amer: 55 mL/min — ABNORMAL LOW (ref >60)
Glucose, Bld: 98 mg/dL (ref 70–99)
Potassium: 3.7 mmol/L (ref 3.5–5.1)
Sodium: 140 mmol/L (ref 135–145)

## 2018-08-13 LAB — TROPONIN I
Troponin I: <0.03 ng/mL (ref <0.03)
Troponin I: <0.03 ng/mL (ref <0.03)

## 2018-08-13 SURGERY — LEFT HEART CATH AND CORONARY ANGIOGRAPHY
Anesthesia: LOCAL

## 2018-08-13 MED ORDER — VERAPAMIL HCL 2.5 MG/ML IV SOLN
INTRAVENOUS | Status: DC | PRN
  Administered 2018-08-13: 10 mL via INTRA_ARTERIAL

## 2018-08-13 MED ORDER — SODIUM CHLORIDE 0.9 % IV SOLN: INTRAVENOUS | Status: AC

## 2018-08-13 MED ORDER — SODIUM CHLORIDE 0.9 % WEIGHT BASED INFUSION
3.0000 mL/kg/h | INTRAVENOUS | Status: DC
  Administered 2018-08-13: 3 mL/kg/h via INTRAVENOUS

## 2018-08-13 MED ORDER — SODIUM CHLORIDE 0.9% FLUSH: 3.0000 mL | Freq: Two times a day (BID) | INTRAVENOUS | Status: DC

## 2018-08-13 MED ORDER — IOHEXOL 350 MG/ML SOLN
INTRAVENOUS | Status: DC | PRN
  Administered 2018-08-13: 50 mL via INTRA_ARTERIAL

## 2018-08-13 MED ORDER — LIDOCAINE HCL (PF) 1 % IJ SOLN
INTRAMUSCULAR | Status: DC | PRN
  Administered 2018-08-13: 2 mL via INTRADERMAL

## 2018-08-13 MED ORDER — SODIUM CHLORIDE 0.9 % IV SOLN: 250.0000 mL | INTRAVENOUS | Status: DC | PRN

## 2018-08-13 MED ORDER — SODIUM CHLORIDE 0.9% FLUSH: 3.0000 mL | INTRAVENOUS | Status: DC | PRN

## 2018-08-13 MED ORDER — ISOSORBIDE MONONITRATE ER 30 MG PO TB24
30.0000 mg | ORAL_TABLET | Freq: Every day | ORAL | Status: DC
  Administered 2018-08-13: 30 mg via ORAL
  Filled 2018-08-13: qty 1

## 2018-08-13 MED ORDER — FENTANYL CITRATE (PF) 100 MCG/2ML IJ SOLN
INTRAMUSCULAR | Status: AC
  Filled 2018-08-13: qty 2

## 2018-08-13 MED ORDER — HEPARIN (PORCINE) IN NACL 1000-0.9 UT/500ML-% IV SOLN
INTRAVENOUS | Status: AC
  Filled 2018-08-13: qty 1000

## 2018-08-13 MED ORDER — ISOSORBIDE MONONITRATE ER 30 MG PO TB24: 30.0000 mg | ORAL_TABLET | Freq: Every day | ORAL | 5 refills | Status: DC

## 2018-08-13 MED ORDER — HEPARIN (PORCINE) IN NACL 1000-0.9 UT/500ML-% IV SOLN
INTRAVENOUS | Status: DC | PRN
  Administered 2018-08-13 (×2): 500 mL

## 2018-08-13 MED ORDER — MIDAZOLAM HCL 2 MG/2ML IJ SOLN
INTRAMUSCULAR | Status: AC
  Filled 2018-08-13: qty 2

## 2018-08-13 MED ORDER — SODIUM CHLORIDE 0.9 % WEIGHT BASED INFUSION: 1.0000 mL/kg/h | INTRAVENOUS | Status: DC

## 2018-08-13 MED ORDER — HEPARIN SODIUM (PORCINE) 1000 UNIT/ML IJ SOLN
INTRAMUSCULAR | Status: DC | PRN
  Administered 2018-08-13: 4000 [IU] via INTRAVENOUS

## 2018-08-13 MED ORDER — LIDOCAINE HCL (PF) 1 % IJ SOLN
INTRAMUSCULAR | Status: AC
  Filled 2018-08-13: qty 30

## 2018-08-13 MED ORDER — HEPARIN SODIUM (PORCINE) 1000 UNIT/ML IJ SOLN
INTRAMUSCULAR | Status: AC
  Filled 2018-08-13: qty 1

## 2018-08-13 MED ORDER — ASPIRIN 81 MG PO CHEW: 81.0000 mg | CHEWABLE_TABLET | ORAL | Status: DC

## 2018-08-13 MED ORDER — ONDANSETRON HCL 4 MG/2ML IJ SOLN: 4.0000 mg | Freq: Four times a day (QID) | INTRAMUSCULAR | Status: DC | PRN

## 2018-08-13 MED ORDER — FENTANYL CITRATE (PF) 100 MCG/2ML IJ SOLN
INTRAMUSCULAR | Status: DC | PRN
  Administered 2018-08-13: 25 ug via INTRAVENOUS

## 2018-08-13 MED ORDER — MIDAZOLAM HCL 2 MG/2ML IJ SOLN
INTRAMUSCULAR | Status: DC | PRN
  Administered 2018-08-13 (×2): 1 mg via INTRAVENOUS

## 2018-08-13 MED ORDER — VERAPAMIL HCL 2.5 MG/ML IV SOLN
INTRAVENOUS | Status: AC
  Filled 2018-08-13: qty 2

## 2018-08-13 SURGICAL SUPPLY — 10 items
CATH INFINITI 5 FR JL3.5 (CATHETERS) ×2 IMPLANT
CATH INFINITI 5FR JK (CATHETERS) ×2 IMPLANT
DEVICE RAD COMP TR BAND LRG (VASCULAR PRODUCTS) ×2 IMPLANT
GLIDESHEATH SLEND SS 6F .021 (SHEATH) ×2 IMPLANT
GUIDEWIRE INQWIRE 1.5J.035X260 (WIRE) ×1 IMPLANT
INQWIRE 1.5J .035X260CM (WIRE) ×2
KIT HEART LEFT (KITS) ×2 IMPLANT
PACK CARDIAC CATHETERIZATION (CUSTOM PROCEDURE TRAY) ×2 IMPLANT
TRANSDUCER W/STOPCOCK (MISCELLANEOUS) ×2 IMPLANT
TUBING CIL FLEX 10 FLL-RA (TUBING) ×2 IMPLANT

## 2018-08-13 NOTE — Discharge Summary (Addendum)
Discharge Summary    Patient ID: Joseph Vaughn MRN: [id number]; DOB: 05/21/1956  Admit date: 08/12/2018 Discharge date: 08/13/2018  Primary Care Provider: Tonia Ghent, MD  Primary Cardiologist: Jenkins Rouge, MD Primary Electrophysiologist:  None   Discharge Diagnoses    Principal Problem:   Chest pain of uncertain etiology, cardiac cath was stable.  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   Allergies Allergies  Allergen Reactions  . Lipitor [Atorvastatin] Other (See Comments)    myalgias    Diagnostic Studies/Procedures    Lt cardiac cath 08/13/18  Previously placed Prox LAD to Mid LAD drug eluting stent is widely patent.  Balloon angioplasty was performed.  Dist RCA lesion is 30% stenosed.  Previously placed Dist Cx stent (unknown type) is widely patent.  The left ventricular systolic function is normal.  LV end diastolic pressure is normal.  The left ventricular ejection fraction is 55-65% by visual estimate.   1.  Widely patent LAD and left circumflex stents with no evidence of obstructive coronary artery disease. 2.  Normal LV systolic function and left ventricular end-diastolic pressure.  Recommendations: Continue medical therapy. _____________   History of Present Illness     63 y.o. male with history of CAD status post multiple stents most recent 05/2018, left bundle branch block, tobacco abuse, hyperlipidemia and bipolar disorder who was seen in cardiology clinic earlier today and presented with chest pain concerning for unstable angina.  Admitted direct from office.    Hospital Course     Consultants: none  Pt with prior stent to pLAD to mLAD and recent DES to dLCX.  He was admitted and serial troponins were neg.  He underwent cath and stents were open.  He  is reassured and will be discharged if no issues post cath.  Follow up in 2 weeks.  He needs lipid appt as well.    Will add imdur.     _____________  Discharge Vitals Blood pressure 127/69, pulse (!) 58, temperature 97.7 F (36.5 C), temperature source Oral, resp. rate (!) 21, height 5\' 5"  (1.651 m), weight 71.4 kg, SpO2 99 %.  Filed Weights   08/12/18 1724 08/13/18 0452  Weight: 72.3 kg 71.4 kg    Labs & Radiologic Studies    CBC Recent Labs    08/12/18 2022  WBC 9.2  HGB 13.0  HCT 38.4*  MCV 90.1  PLT 937   Basic Metabolic Panel Recent Labs    08/12/18 2022 08/13/18 0858  NA 136 140  K 3.5 3.7  CL 103 106  CO2 26 26  GLUCOSE 77 98  BUN 10 10  CREATININE 1.33* 1.36*  CALCIUM 8.6* 9.0   Liver Function Tests Recent Labs    08/12/18 2022  AST 22  ALT 29  ALKPHOS 74  BILITOT 0.2*  PROT 6.5  ALBUMIN 3.6   No results for input(s): LIPASE, AMYLASE in the last 72 hours. Cardiac Enzymes Recent Labs    08/12/18 2022 08/13/18 0318 08/13/18 0858  TROPONINI <0.03 <0.03 <0.03   BNP Invalid input(s): POCBNP D-Dimer No results for input(s): DDIMER in the last 72 hours. Hemoglobin A1C No results for input(s): HGBA1C in the last 72 hours. Fasting Lipid Panel No results for input(s): CHOL, HDL, LDLCALC, TRIG, CHOLHDL, LDLDIRECT in  the last 72 hours. Thyroid Function Tests No results for input(s): TSH, T4TOTAL, T3FREE, THYROIDAB in the last 72 hours.  Invalid input(s): FREET3 _____________  No results found. Disposition   Pt is being discharged home today in good condition.  Follow-up Plans & Appointments   Will add Imdur to help prevent any pain  Heart healthy diet.  Call [facility] at [phone] if any bleeding, swelling or drainage at cath site.  May shower, no tub baths for 48 hours for groin sticks. No lifting over 5 pounds for 3 days.  No Driving for 3 days   Follow-up Information    [place], Crista Luria, [place] Follow  up on 08/26/2018.   Specialty:  Cardiology Why:  at 3:30 pm  Contact information: [facility] [address] [phone]            Discharge Medications   Allergies as of 08/13/2018      Reactions   Lipitor [atorvastatin] Other (See Comments)   myalgias      Medication List    TAKE these medications   acetaminophen 500 MG tablet Commonly known as:  TYLENOL Take 1,000 mg by mouth every 6 (six) hours as needed for mild pain or headache.   aspirin 81 MG EC tablet Take 1 tablet (81 mg total) by mouth daily.   buPROPion 150 MG 12 hr tablet Commonly known as:  WELLBUTRIN SR Take 150 mg by mouth daily.   isosorbide mononitrate 30 MG 24 hr tablet Commonly known as:  IMDUR Take 1 tablet (30 mg total) by mouth daily.   lamoTRIgine 150 MG tablet Commonly known as:  LAMICTAL Take 150 mg by mouth daily.   metoprolol succinate 25 MG 24 hr tablet Commonly known as:  TOPROL-XL Take 1 tablet (25 mg total) by mouth daily.   nitroGLYCERIN 0.4 MG SL tablet Commonly known as:  NITROSTAT Place 1 tablet (0.4 mg total) under the tongue every 5 (five) minutes as needed for chest pain (CP or SOB).   rosuvastatin 40 MG tablet Commonly known as:  CRESTOR Take 1 tablet (40 mg total) by mouth daily at 6 PM.   ticagrelor 90 MG Tabs tablet Commonly known as:  BRILINTA Take 1 tablet (90 mg total) by mouth 2 (two) times daily.        Acute coronary syndrome (MI, NSTEMI, STEMI, etc) this admission?: No.    Outstanding Labs/Studies   -Lipid clinic appt. If pt is willing   Duration of Discharge Encounter   Greater than 30 minutes including physician time.  Signed, Cecilie Kicks, NP 08/13/2018, 3:50 PM  Attending Note:   The patient was seen and examined.  Agree with assessment and plan as noted above.  Changes made to the above note as needed.  Patient seen and independently examined with Cecilie Kicks, NP .   We discussed all aspects of the encounter. I  agree with the assessment and plan as stated above.  1 1.  Stuttering chest pain: His symptoms are concerning for unstable angina.  He was sent over from the office for further evaluation and for heart catheterization.    Cath looked stable with no culprit lesions Will continue with current medical therapy Off work until Monday , March 9.   2.  Hyperlipidemia: The plan is to refer him to lipid clinic.  2.  Left bundle branch block-this is chronic.   I have spent a total of 40 minutes with patient reviewing hospital  notes , telemetry, EKGs, labs and examining patient as well as establishing an assessment and plan that was discussed with the patient. > 50% of time was spent in direct patient care.    Thayer Headings, Brooke Bonito., MD, [facility] 08/14/2018, 4:27 PM [address]  [place] Pager [phone]

## 2018-08-13 NOTE — Progress Notes (Signed)
Received patient post Ca Cath, R radial site level ) with 13cc of air per report. Instructed patient to minimize movement of the R hand. Will monitor accordingly.

## 2018-08-13 NOTE — Discharge Instructions (Signed)
Will add Imdur to help prevent any pain  Heart healthy diet.  Call [facility] at [phone] if any bleeding, swelling or drainage at cath site.  May shower, no tub baths for 48 hours for groin sticks. No lifting over 5 pounds for 3 days.  No Driving for 3 days

## 2018-08-13 NOTE — Progress Notes (Addendum)
Progress Note  Patient Name: Joseph Vaughn Date of Encounter: 08/13/2018  Primary Cardiologist: Jenkins Rouge, MD   Subjective   No further pain, just woke up he is NPO  Inpatient Medications    Scheduled Meds: . aspirin EC  81 mg Oral Daily  . buPROPion  150 mg Oral Daily  . lamoTRIgine  150 mg Oral Daily  . metoprolol succinate  25 mg Oral Daily  . rosuvastatin  40 mg Oral q1800  . ticagrelor  90 mg Oral BID   Continuous Infusions:  PRN Meds: acetaminophen, nitroGLYCERIN   Vital Signs    Vitals:   08/12/18 1946 08/12/18 2355 08/12/18 2355 08/13/18 0452  BP: 114/63 (!) 117/59  113/67  Pulse: 66 60 60 (!) 56  Resp: 18 18  18   Temp: 98 F (36.7 C) 97.9 F (36.6 C)  97.8 F (36.6 C)  TempSrc: Oral Oral  Oral  SpO2: 99% (!) 73% 97% 96%  Weight:    71.4 kg  Height:        Intake/Output Summary (Last 24 hours) at 08/13/2018 0823 Last data filed at 08/12/2018 2307 Gross per 24 hour  Intake 540 ml  Output 0 ml  Net 540 ml   Last 3 Weights 08/13/2018 08/12/2018 08/12/2018  Weight (lbs) 157 lb 6.4 oz 159 lb 6.3 oz 165 lb  Weight (kg) 71.396 kg 72.3 kg 74.844 kg      Telemetry    SR - Personally Reviewed  ECG    SR LBB - Personally Reviewed  Physical Exam   GEN: No acute distress.   Neck: No JVD Cardiac: RRR, no murmurs, rubs, or gallops.  Respiratory: Clear to auscultation bilaterally. GI: Soft, nontender, non-distended  MS: No edema; No deformity. Neuro:  Nonfocal  Psych: Normal affect   Labs    Chemistry Recent Labs  Lab 08/12/18 2022  NA 136  K 3.5  CL 103  CO2 26  GLUCOSE 77  BUN 10  CREATININE 1.33*  CALCIUM 8.6*  PROT 6.5  ALBUMIN 3.6  AST 22  ALT 29  ALKPHOS 74  BILITOT 0.2*  GFRNONAA 57*  GFRAA >60  ANIONGAP 7     Hematology Recent Labs  Lab 08/12/18 2022  WBC 9.2  RBC 4.26  HGB 13.0  HCT 38.4*  MCV 90.1  MCH 30.5  MCHC 33.9  RDW 11.9  PLT 215    Cardiac Enzymes Recent Labs  Lab 08/12/18 2022 08/13/18 0318   TROPONINI <0.03 <0.03   No results for input(s): TROPIPOC in the last 168 hours.   BNP Recent Labs  Lab 08/12/18 2022  BNP 15.1     DDimer No results for input(s): DDIMER in the last 168 hours.   Radiology    No results found.  Cardiac Studies   Cardiac catheterization June 03, 2018 Procedures   CORONARY STENT INTERVENTION  Conclusion     Previously placed Prox LAD to Mid LAD drug eluting stent is widely patent.  Balloon angioplasty was performed.  Dist Cx lesion is 90% stenosed.  A drug-eluting stent was successfully placed using a STENT RESOLUTE ONYX 2.25X18.  Post intervention, there is a 0% residual stenosis.  1. Severe stenosis distal Circumflex 2. Successful PTCA/DES x 1 distal Circumflex.   Recommendation: DAPT with ASA/Brilinta for one year. Continue statin and beta blocker. Possible d/c later today after 6 hours.     2D echocardiogram June 01, 2018 Study Conclusions  - Left ventricle: The cavity size was normal. Wall  thickness was increased in a pattern of mild LVH. Systolic function was normal. The estimated ejection fraction was in the range of 55% to 60%. Wall motion was normal; there were no regional wall motion abnormalities. Doppler parameters are consistent with abnormal left ventricular relaxation (grade 1 diastolic dysfunction). - Atrial septum: No defect or patent foramen ovale was identified.    Patient Profile     63 y.o. male with history of CAD status post multiple stents, left bundle branch block, tobacco abuse, hyperlipidemia and bipolar disorder who was seen in cardiology clinic earlier today with complaints of chest pain concerning for unstable angina.  Admitted for possible cath.   Assessment & Plan    Chest pain - NTG without much relief.  Pain lasted 2.5 hours.  He continued with stuttering chest pain yesterday. Troponin is neg.  Dr.Lexxie Winberg to see cath vs nuc study pt is NPO  CAD with hx of  multiple stents.   HLD referred to lipid clinic  Chronic LBBB  For questions or updates, please contact [facility] Please consult [email or web address] for contact info under        Signed, Cecilie Kicks, NP  08/13/2018, 8:23 AM     Attending Note:   The patient was seen and examined.  Agree with assessment and plan as noted above.  Changes made to the above note as needed.  Patient seen and independently examined with Cecilie Kicks, NP .   We discussed all aspects of the encounter. I agree with the assessment and plan as stated above.  1 1.  Stuttering chest pain: His symptoms are concerning for unstable angina.  He was sent over from the office for further evaluation and for heart catheterization.   He had a fist in his chest like sensatin - very similar to his previous episodes of angina .  We discussed the risks, benefits, options.  He understands and agrees to proceed. Has a history of coronary artery disease and has had coronary stenting in the past.  2.  Hyperlipidemia: The plan is to refer him to lipid clinic.  2.  Left bundle branch block-this is chronic.   I have spent a total of 40 minutes with patient reviewing hospital  notes , telemetry, EKGs, labs and examining patient as well as establishing an assessment and plan that was discussed with the patient. > 50% of time was spent in direct patient care.    Thayer Headings, Brooke Bonito., MD, [facility] 08/13/2018, 9:59 AM [address],  [place] Pager [phone]

## 2018-08-13 NOTE — Interval H&P Note (Signed)
Cath Lab Visit (complete for each Cath Lab visit)  Clinical Evaluation Leading to the Procedure:   ACS: Yes.   unstable angina  Non-ACS:  n/a   History and Physical Interval Note:  08/13/2018 2:32 PM  Joseph Vaughn  has presented today for surgery, with the diagnosis of ua  The various methods of treatment have been discussed with the patient and family. After consideration of risks, benefits and other options for treatment, the patient has consented to  Procedure(s): LEFT HEART CATH AND CORONARY ANGIOGRAPHY (N/A) as a surgical intervention .  The patient's history has been reviewed, patient examined, no change in status, stable for surgery.  I have reviewed the patient's chart and labs.  Questions were answered to the patient's satisfaction.     Kathlyn Sacramento

## 2018-08-13 NOTE — Progress Notes (Signed)
    Cath showed moderate CAD  Return to work on August 18, 2018     Mertie Moores, MD  08/13/2018 4:39 PM    [facility] [place],  [place], [place]  [address] Pager [phone] Phone: [phone]; Fax: [phone]

## 2018-08-13 NOTE — H&P (View-Only) (Signed)
Progress Note  Patient Name: Joseph Vaughn Date of Encounter: 08/13/2018  Primary Cardiologist: Jenkins Rouge, MD   Subjective   No further pain, just woke up he is NPO  Inpatient Medications    Scheduled Meds: . aspirin EC  81 mg Oral Daily  . buPROPion  150 mg Oral Daily  . lamoTRIgine  150 mg Oral Daily  . metoprolol succinate  25 mg Oral Daily  . rosuvastatin  40 mg Oral q1800  . ticagrelor  90 mg Oral BID   Continuous Infusions:  PRN Meds: acetaminophen, nitroGLYCERIN   Vital Signs    Vitals:   08/12/18 1946 08/12/18 2355 08/12/18 2355 08/13/18 0452  BP: 114/63 (!) 117/59  113/67  Pulse: 66 60 60 (!) 56  Resp: 18 18  18   Temp: 98 F (36.7 C) 97.9 F (36.6 C)  97.8 F (36.6 C)  TempSrc: Oral Oral  Oral  SpO2: 99% (!) 73% 97% 96%  Weight:    71.4 kg  Height:        Intake/Output Summary (Last 24 hours) at 08/13/2018 0823 Last data filed at 08/12/2018 2307 Gross per 24 hour  Intake 540 ml  Output 0 ml  Net 540 ml   Last 3 Weights 08/13/2018 08/12/2018 08/12/2018  Weight (lbs) 157 lb 6.4 oz 159 lb 6.3 oz 165 lb  Weight (kg) 71.396 kg 72.3 kg 74.844 kg      Telemetry    SR - Personally Reviewed  ECG    SR LBB - Personally Reviewed  Physical Exam   GEN: No acute distress.   Neck: No JVD Cardiac: RRR, no murmurs, rubs, or gallops.  Respiratory: Clear to auscultation bilaterally. GI: Soft, nontender, non-distended  MS: No edema; No deformity. Neuro:  Nonfocal  Psych: Normal affect   Labs    Chemistry Recent Labs  Lab 08/12/18 2022  NA 136  K 3.5  CL 103  CO2 26  GLUCOSE 77  BUN 10  CREATININE 1.33*  CALCIUM 8.6*  PROT 6.5  ALBUMIN 3.6  AST 22  ALT 29  ALKPHOS 74  BILITOT 0.2*  GFRNONAA 57*  GFRAA >60  ANIONGAP 7     Hematology Recent Labs  Lab 08/12/18 2022  WBC 9.2  RBC 4.26  HGB 13.0  HCT 38.4*  MCV 90.1  MCH 30.5  MCHC 33.9  RDW 11.9  PLT 215    Cardiac Enzymes Recent Labs  Lab 08/12/18 2022 08/13/18 0318   TROPONINI <0.03 <0.03   No results for input(s): TROPIPOC in the last 168 hours.   BNP Recent Labs  Lab 08/12/18 2022  BNP 15.1     DDimer No results for input(s): DDIMER in the last 168 hours.   Radiology    No results found.  Cardiac Studies   Cardiac catheterization June 03, 2018 Procedures   CORONARY STENT INTERVENTION  Conclusion     Previously placed Prox LAD to Mid LAD drug eluting stent is widely patent.  Balloon angioplasty was performed.  Dist Cx lesion is 90% stenosed.  A drug-eluting stent was successfully placed using a STENT RESOLUTE ONYX 2.25X18.  Post intervention, there is a 0% residual stenosis.  1. Severe stenosis distal Circumflex 2. Successful PTCA/DES x 1 distal Circumflex.   Recommendation: DAPT with ASA/Brilinta for one year. Continue statin and beta blocker. Possible d/c later today after 6 hours.     2D echocardiogram June 01, 2018 Study Conclusions  - Left ventricle: The cavity size was normal. Wall  thickness was increased in a pattern of mild LVH. Systolic function was normal. The estimated ejection fraction was in the range of 55% to 60%. Wall motion was normal; there were no regional wall motion abnormalities. Doppler parameters are consistent with abnormal left ventricular relaxation (grade 1 diastolic dysfunction). - Atrial septum: No defect or patent foramen ovale was identified.    Patient Profile     63 y.o. male with history of CAD status post multiple stents, left bundle branch block, tobacco abuse, hyperlipidemia and bipolar disorder who was seen in cardiology clinic earlier today with complaints of chest pain concerning for unstable angina.  Admitted for possible cath.   Assessment & Plan    Chest pain - NTG without much relief.  Pain lasted 2.5 hours.  He continued with stuttering chest pain yesterday. Troponin is neg.  Dr.Ember Henrikson to see cath vs nuc study pt is NPO  CAD with hx of  multiple stents.   HLD referred to lipid clinic  Chronic LBBB  For questions or updates, please contact [facility] Please consult [email or web address] for contact info under        Signed, Cecilie Kicks, NP  08/13/2018, 8:23 AM     Attending Note:   The patient was seen and examined.  Agree with assessment and plan as noted above.  Changes made to the above note as needed.  Patient seen and independently examined with Cecilie Kicks, NP .   We discussed all aspects of the encounter. I agree with the assessment and plan as stated above.  1 1.  Stuttering chest pain: His symptoms are concerning for unstable angina.  He was sent over from the office for further evaluation and for heart catheterization.   He had a fist in his chest like sensatin - very similar to his previous episodes of angina .  We discussed the risks, benefits, options.  He understands and agrees to proceed. Has a history of coronary artery disease and has had coronary stenting in the past.  2.  Hyperlipidemia: The plan is to refer him to lipid clinic.  2.  Left bundle branch block-this is chronic.   I have spent a total of 40 minutes with patient reviewing hospital  notes , telemetry, EKGs, labs and examining patient as well as establishing an assessment and plan that was discussed with the patient. > 50% of time was spent in direct patient care.    Thayer Headings, Brooke Bonito., MD, [facility] 08/13/2018, 9:59 AM [address],  [place] Pager [phone]

## 2018-08-13 NOTE — Progress Notes (Signed)
Noted a little bleeding on the Right radial site. Patient stated he went to the bathroom, wife at bedside. 3cc air was placed back to TR band. Will monitor.

## 2018-08-26 ENCOUNTER — Ambulatory Visit: Payer: BLUE CROSS/BLUE SHIELD | Admitting: Physician Assistant

## 2018-08-26 ENCOUNTER — Telehealth: Payer: Self-pay | Admitting: Physician Assistant

## 2018-08-26 NOTE — Telephone Encounter (Signed)
Spoke with patient over phone. He has very exertional job and not having any cardiac symptoms. Not taking Imdur for past 10 days due to headache. The patient denies nausea, vomiting, fever, chest pain, palpitations, shortness of breath, orthopnea, PND, dizziness, syncope, cough, congestion, abdominal pain, hematochezia, melena, lower extremity edema. Due to CD 12 outbreak will reschedule appointment in few months.

## 2018-10-30 ENCOUNTER — Telehealth: Payer: Self-pay

## 2018-10-30 NOTE — Telephone Encounter (Signed)
Virtual Visit Pre-Appointment Phone Call  1. "What is the BEST phone number to call the day of the visit?" - include this in appointment notes  2. "Do you have or have access to (through a family member/friend) a smartphone with video capability that we can use for your visit?" a. If yes - list this number in appt notes as "cell" (if different from BEST phone #) and list the appointment type as a VIDEO visit in appointment notes b. If no - list the appointment type as a PHONE visit in appointment notes  3. Confirm consent - "In the setting of the current Covid19 crisis, you are scheduled for a (phone or video) visit with your provider on (date) at (time).  Just as we do with many in-office visits, in order for you to participate in this visit, we must obtain consent.  If you'd like, I can send this to your mychart (if signed up) or email for you to review.  Otherwise, I can obtain your verbal consent now.  All virtual visits are billed to your insurance company just like a normal visit would be.  By agreeing to a virtual visit, we'd like you to understand that the technology does not allow for your provider to perform an examination, and thus may limit your provider's ability to fully assess your condition. If your provider identifies any concerns that need to be evaluated in person, we will make arrangements to do so.  Finally, though the technology is pretty good, we cannot assure that it will always work on either your or our end, and in the setting of a video visit, we may have to convert it to a phone-only visit.  In either situation, we cannot ensure that we have a secure connection.  Are you willing to proceed? Yes  4. Advise patient to be prepared - "Two hours prior to your appointment, go ahead and check your blood pressure, pulse, oxygen saturation, and your weight (if you have the equipment to check those) and write them all down. When your visit starts, your provider will ask you for this  information. If you have an Apple Watch or Kardia device, please plan to have heart rate information ready on the day of your appointment. Please have a pen and paper handy nearby the day of the visit as well."  5. Give patient instructions for MyChart download to smartphone OR Doximity/[email or web address] as below if video visit (depending on what platform provider is using)  6. Inform patient they will receive a phone call 15 minutes prior to their appointment time (may be from unknown caller ID) so they should be prepared to answer    TELEPHONE CALL NOTE  Joseph Vaughn has been deemed a candidate for a follow-up tele-health visit to limit community exposure during the Covid-19 pandemic. I spoke with the patient via phone to ensure availability of phone/video source, confirm preferred email & phone number, and discuss instructions and expectations.  I reminded Joseph Vaughn to be prepared with any vital sign and/or heart rhythm information that could potentially be obtained via home monitoring, at the time of his visit. I reminded Joseph Vaughn to expect a phone call prior to his visit.  Michaelyn Barter, RN 10/30/2018 10:26 AM   IF USING DOXIMITY or DOXY.ME - The patient will receive a link just prior to their visit by text.     FULL LENGTH CONSENT FOR TELE-HEALTH VISIT   I hereby voluntarily request, consent  and authorize [facility] and its employed or contracted physicians, Engineer, materials, nurse practitioners or other licensed health care professionals (the Practitioner), to provide me with telemedicine health care services (the "Services") as deemed necessary by the treating Practitioner. I acknowledge and consent to receive the Services by the Practitioner via telemedicine. I understand that the telemedicine visit will involve communicating with the Practitioner through live audiovisual communication technology and the disclosure of certain medical information by electronic  transmission. I acknowledge that I have been given the opportunity to request an in-person assessment or other available alternative prior to the telemedicine visit and am voluntarily participating in the telemedicine visit.  I understand that I have the right to withhold or withdraw my consent to the use of telemedicine in the course of my care at any time, without affecting my right to future care or treatment, and that the Practitioner or I may terminate the telemedicine visit at any time. I understand that I have the right to inspect all information obtained and/or recorded in the course of the telemedicine visit and may receive copies of available information for a reasonable fee.  I understand that some of the potential risks of receiving the Services via telemedicine include:  Marland Kitchen Delay or interruption in medical evaluation due to technological equipment failure or disruption; . Information transmitted may not be sufficient (e.g. poor resolution of images) to allow for appropriate medical decision making by the Practitioner; and/or  . In rare instances, security protocols could fail, causing a breach of personal health information.  Furthermore, I acknowledge that it is my responsibility to provide information about my medical history, conditions and care that is complete and accurate to the best of my ability. I acknowledge that Practitioner's advice, recommendations, and/or decision may be based on factors not within their control, such as incomplete or inaccurate data provided by me or distortions of diagnostic images or specimens that may result from electronic transmissions. I understand that the practice of medicine is not an exact science and that Practitioner makes no warranties or guarantees regarding treatment outcomes. I acknowledge that I will receive a copy of this consent concurrently upon execution via email to the email address I last provided but may also request a printed copy by  calling the office of [facility].    I understand that my insurance will be billed for this visit.   I have read or had this consent read to me. . I understand the contents of this consent, which adequately explains the benefits and risks of the Services being provided via telemedicine.  . I have been provided ample opportunity to ask questions regarding this consent and the Services and have had my questions answered to my satisfaction. . I give my informed consent for the services to be provided through the use of telemedicine in my medical care  By participating in this telemedicine visit I agree to the above.
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
## 2018-11-14 ENCOUNTER — Encounter: Payer: Self-pay | Admitting: Cardiovascular Disease

## 2018-11-14 ENCOUNTER — Telehealth (INDEPENDENT_AMBULATORY_CARE_PROVIDER_SITE_OTHER): Payer: BC Managed Care – PPO | Admitting: Cardiovascular Disease

## 2018-11-14 ENCOUNTER — Other Ambulatory Visit: Payer: Self-pay

## 2018-11-14 VITALS — BP 128/81 | HR 59 | Ht 65.0 in | Wt 155.0 lb

## 2018-11-14 DIAGNOSIS — I2510 Atherosclerotic heart disease of native coronary artery without angina pectoris: Secondary | ICD-10-CM

## 2018-11-14 NOTE — Patient Instructions (Addendum)
Medication Instructions:   If you need a refill on your cardiac medications before your next appointment, please call your pharmacy.   Lab work: Your physician recommends that you return for lab work in: 3 months for fasting lipid and liver panel. Someone from our office will call you and make this appointment for you.  If you have labs (blood work) drawn today and your tests are completely normal, you will receive your results only by: Marland Kitchen MyChart Message (if you have MyChart) OR . A paper copy in the mail If you have any lab test that is abnormal or we need to change your treatment, we will call you to review the results.  Testing/Procedures: None ordered today.  Follow-Up: At [facility], you and your health needs are our priority.  As part of our continuing mission to provide you with exceptional heart care, we have created designated Provider Care Teams.  These Care Teams include your primary Cardiologist (physician) and Advanced Practice Providers (APPs -  Physician Assistants and Nurse Practitioners) who all work together to provide you with the care you need, when you need it. You will need a follow up appointment in 6 months.  Please call our office 2 months in advance to schedule this appointment.  You may see Jenkins Rouge, MD or one of the following Advanced Practice Providers on your designated Care Team:   Truitt Merle, NP Cecilie Kicks, NP . Kathyrn Drown, NP

## 2019-02-02 ENCOUNTER — Telehealth: Payer: Self-pay | Admitting: Cardiovascular Disease

## 2019-02-02 MED ORDER — METOPROLOL SUCCINATE ER 25 MG PO TB24: 25.0000 mg | ORAL_TABLET | Freq: Every day | ORAL | 11 refills | Status: DC

## 2019-02-02 NOTE — Telephone Encounter (Signed)
New Message  Pt c/o BP issue:  1. What are your last 5 BP readings? 160/77 2. Are you having any other symptoms (ex. Dizziness, headache, blurred vision, passed out)? No symptoms, but then he said about 4 days ago he got up to quick and nearly hit the floor.  3. What is your medication issue? None

## 2019-02-02 NOTE — Telephone Encounter (Signed)
Call placed to Pt.  Pt c/o "hard beats" in his chest that pulse to feet and hangs.  Pt states blood pressure last night higher than 170/85.  BP this AM was 160/77.  When asked when Pt takes his metoprolol Pt states he stopped taking it because it made him feel sick.  He can't remember exactly why he stopped it.  Advised Pt to restart and try taking it at bedtime to reduce side effects.  Pt states he will restart, asked for prescription to be sent to pharmacy.  Sent refill as requested.  Advised if he felt badly again when taking Toprol at night to return call to office so we can review and make med change.  Pt indicates understanding and thanked nurse for return call.

## 2019-02-05 ENCOUNTER — Ambulatory Visit: Payer: BC Managed Care – PPO | Admitting: Cardiology

## 2019-02-13 ENCOUNTER — Other Ambulatory Visit: Payer: BC Managed Care – PPO | Admitting: *Deleted

## 2019-02-13 ENCOUNTER — Other Ambulatory Visit: Payer: Self-pay

## 2019-02-13 DIAGNOSIS — I2510 Atherosclerotic heart disease of native coronary artery without angina pectoris: Secondary | ICD-10-CM

## 2019-02-14 LAB — LIPID PANEL
Chol/HDL Ratio: 3.4 ratio (ref 0.0–5.0)
Cholesterol, Total: 115 mg/dL (ref 100–199)
HDL: 34 mg/dL — ABNORMAL LOW (ref >39)
LDL Chol Calc (NIH): 53 mg/dL (ref 0–99)
Triglycerides: 165 mg/dL — ABNORMAL HIGH (ref 0–149)
VLDL Cholesterol Cal: 28 mg/dL (ref 5–40)

## 2019-02-14 LAB — HEPATIC FUNCTION PANEL
ALT: 30 IU/L (ref 0–44)
AST: 33 IU/L (ref 0–40)
Albumin: 4.5 g/dL (ref 3.8–4.8)
Alkaline Phosphatase: 80 IU/L (ref 39–117)
Bilirubin Total: 0.5 mg/dL (ref 0.0–1.2)
Bilirubin, Direct: 0.13 mg/dL (ref 0.00–0.40)
Total Protein: 6.8 g/dL (ref 6.0–8.5)

## 2019-02-19 ENCOUNTER — Telehealth: Payer: Self-pay | Admitting: *Deleted

## 2019-02-19 DIAGNOSIS — E785 Hyperlipidemia, unspecified: Secondary | ICD-10-CM

## 2019-02-19 DIAGNOSIS — I2510 Atherosclerotic heart disease of native coronary artery without angina pectoris: Secondary | ICD-10-CM

## 2019-02-19 NOTE — Telephone Encounter (Signed)
Pt has been notified of lab results by phone with verbal understanding. Pt is agreeable to repeat labs in 6 months 08/24/19 for FLP/LFT. Pt has been scheduled today for appt with Dr. Johnsie Cancel per recall for 6 month f/u appt. 05/22/19 @ 10:45 am. Pt request a copy of labs to be mailed to him. Pt address has been verified while on the phone. Pt thanked me for the call. Patient notified of result.  Please refer to phone note from today for complete details.   Julaine Hua, [place] 02/19/2019 11:47 AM

## 2019-04-13 DIAGNOSIS — C44622 Squamous cell carcinoma of skin of right upper limb, including shoulder: Secondary | ICD-10-CM | POA: Diagnosis not present

## 2019-05-12 DIAGNOSIS — C44622 Squamous cell carcinoma of skin of right upper limb, including shoulder: Secondary | ICD-10-CM | POA: Diagnosis not present
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
## 2019-05-22 ENCOUNTER — Encounter: Payer: Self-pay | Admitting: Cardiovascular Disease

## 2019-05-22 ENCOUNTER — Ambulatory Visit: Payer: BC Managed Care – PPO | Admitting: Cardiovascular Disease

## 2019-05-22 ENCOUNTER — Other Ambulatory Visit: Payer: Self-pay

## 2019-05-22 VITALS — BP 122/68 | HR 67 | Ht 65.0 in | Wt 164.0 lb

## 2019-05-22 DIAGNOSIS — I2510 Atherosclerotic heart disease of native coronary artery without angina pectoris: Secondary | ICD-10-CM

## 2019-05-22 NOTE — Patient Instructions (Signed)
Medication Instructions:   *If you need a refill on your cardiac medications before your next appointment, please call your pharmacy*  Lab Work:  If you have labs (blood work) drawn today and your tests are completely normal, you will receive your results only by: Marland Kitchen MyChart Message (if you have MyChart) OR . A paper copy in the mail If you have any lab test that is abnormal or we need to change your treatment, we will call you to review the results.  Testing/Procedures: None ordered today.  Follow-Up: At [facility], you and your health needs are our priority.  As part of our continuing mission to provide you with exceptional heart care, we have created designated Provider Care Teams.  These Care Teams include your primary Cardiologist (physician) and Advanced Practice Providers (APPs -  Physician Assistants and Nurse Practitioners) who all work together to provide you with the care you need, when you need it.  Your next appointment:   1 year(s)  The format for your next appointment:   In Person  Provider:   You may see Jenkins Rouge, MD or one of the following Advanced Practice Providers on your designated Care Team:    Truitt Merle, NP  Cecilie Kicks, NP  Kathyrn Drown, NP

## 2019-06-21 ENCOUNTER — Other Ambulatory Visit: Payer: Self-pay | Admitting: Physician Assistant

## 2019-07-07 ENCOUNTER — Other Ambulatory Visit: Payer: Self-pay | Admitting: Physician Assistant

## 2019-07-21 ENCOUNTER — Observation Stay (HOSPITAL_BASED_OUTPATIENT_CLINIC_OR_DEPARTMENT_OTHER): Payer: BC Managed Care – PPO

## 2019-07-21 ENCOUNTER — Emergency Department (HOSPITAL_COMMUNITY): Payer: BC Managed Care – PPO

## 2019-07-21 ENCOUNTER — Other Ambulatory Visit: Payer: Self-pay

## 2019-07-21 ENCOUNTER — Other Ambulatory Visit (HOSPITAL_COMMUNITY): Payer: BC Managed Care – PPO

## 2019-07-21 ENCOUNTER — Encounter (HOSPITAL_COMMUNITY): Payer: Self-pay | Admitting: Emergency Medicine

## 2019-07-21 ENCOUNTER — Observation Stay (HOSPITAL_COMMUNITY): Payer: BC Managed Care – PPO

## 2019-07-21 ENCOUNTER — Observation Stay (HOSPITAL_COMMUNITY)
Admission: EM | Admit: 2019-07-21 | Discharge: 2019-07-21 | Discharge status: Against Medical Advice | Payer: BC Managed Care – PPO | Attending: Internal Medicine | Admitting: Internal Medicine

## 2019-07-21 DIAGNOSIS — Z7982 Long term (current) use of aspirin: Secondary | ICD-10-CM | POA: Diagnosis not present

## 2019-07-21 DIAGNOSIS — R079 Chest pain, unspecified: Secondary | ICD-10-CM

## 2019-07-21 DIAGNOSIS — I119 Hypertensive heart disease without heart failure: Secondary | ICD-10-CM | POA: Insufficient documentation

## 2019-07-21 DIAGNOSIS — I700 Atherosclerosis of aorta: Secondary | ICD-10-CM | POA: Diagnosis not present

## 2019-07-21 DIAGNOSIS — N1831 Chronic kidney disease, stage 3a: Secondary | ICD-10-CM | POA: Diagnosis not present

## 2019-07-21 DIAGNOSIS — E785 Hyperlipidemia, unspecified: Secondary | ICD-10-CM

## 2019-07-21 DIAGNOSIS — Z79899 Other long term (current) drug therapy: Secondary | ICD-10-CM | POA: Diagnosis not present

## 2019-07-21 DIAGNOSIS — R0789 Other chest pain: Secondary | ICD-10-CM | POA: Diagnosis not present

## 2019-07-21 DIAGNOSIS — F319 Bipolar disorder, unspecified: Secondary | ICD-10-CM | POA: Diagnosis not present

## 2019-07-21 DIAGNOSIS — E7800 Pure hypercholesterolemia, unspecified: Secondary | ICD-10-CM | POA: Insufficient documentation

## 2019-07-21 DIAGNOSIS — I2510 Atherosclerotic heart disease of native coronary artery without angina pectoris: Secondary | ICD-10-CM | POA: Insufficient documentation

## 2019-07-21 DIAGNOSIS — I129 Hypertensive chronic kidney disease with stage 1 through stage 4 chronic kidney disease, or unspecified chronic kidney disease: Secondary | ICD-10-CM | POA: Insufficient documentation

## 2019-07-21 DIAGNOSIS — Z87891 Personal history of nicotine dependence: Secondary | ICD-10-CM | POA: Diagnosis not present

## 2019-07-21 DIAGNOSIS — I252 Old myocardial infarction: Secondary | ICD-10-CM | POA: Diagnosis not present

## 2019-07-21 DIAGNOSIS — I447 Left bundle-branch block, unspecified: Secondary | ICD-10-CM | POA: Insufficient documentation

## 2019-07-21 DIAGNOSIS — M546 Pain in thoracic spine: Secondary | ICD-10-CM | POA: Diagnosis not present

## 2019-07-21 DIAGNOSIS — N200 Calculus of kidney: Secondary | ICD-10-CM | POA: Insufficient documentation

## 2019-07-21 DIAGNOSIS — R0602 Shortness of breath: Secondary | ICD-10-CM | POA: Diagnosis not present

## 2019-07-21 DIAGNOSIS — I10 Essential (primary) hypertension: Secondary | ICD-10-CM | POA: Diagnosis not present

## 2019-07-21 DIAGNOSIS — M549 Dorsalgia, unspecified: Secondary | ICD-10-CM

## 2019-07-21 DIAGNOSIS — Z720 Tobacco use: Secondary | ICD-10-CM

## 2019-07-21 DIAGNOSIS — Z955 Presence of coronary angioplasty implant and graft: Secondary | ICD-10-CM | POA: Diagnosis not present

## 2019-07-21 HISTORY — DX: Atherosclerotic heart disease of native coronary artery without angina pectoris: I25.10

## 2019-07-21 LAB — COMPREHENSIVE METABOLIC PANEL
ALT: 50 U/L — ABNORMAL HIGH (ref 0–44)
AST: 56 U/L — ABNORMAL HIGH (ref 15–41)
Albumin: 4 g/dL (ref 3.5–5.0)
Alkaline Phosphatase: 63 U/L (ref 38–126)
Anion gap: 9 (ref 5–15)
BUN: 15 mg/dL (ref 8–23)
CO2: 27 mmol/L (ref 22–32)
Calcium: 9.6 mg/dL (ref 8.9–10.3)
Chloride: 103 mmol/L (ref 98–111)
Creatinine, Ser: 1.47 mg/dL — ABNORMAL HIGH (ref 0.61–1.24)
GFR calc Af Amer: 58 mL/min — ABNORMAL LOW (ref >60)
GFR calc non Af Amer: 50 mL/min — ABNORMAL LOW (ref >60)
Glucose, Bld: 95 mg/dL (ref 70–99)
Potassium: 4.1 mmol/L (ref 3.5–5.1)
Sodium: 139 mmol/L (ref 135–145)
Total Bilirubin: 0.7 mg/dL (ref 0.3–1.2)
Total Protein: 7 g/dL (ref 6.5–8.1)

## 2019-07-21 LAB — LIPID PANEL
Cholesterol: 86 mg/dL (ref 0–200)
HDL: 29 mg/dL — ABNORMAL LOW (ref >40)
LDL Cholesterol: 35 mg/dL (ref 0–99)
Total CHOL/HDL Ratio: 3 RATIO
Triglycerides: 111 mg/dL (ref <150)
VLDL: 22 mg/dL (ref 0–40)

## 2019-07-21 LAB — CBC WITH DIFFERENTIAL/PLATELET
Abs Immature Granulocytes: 0.03 10*3/uL (ref 0.00–0.07)
Basophils Absolute: 0.1 10*3/uL (ref 0.0–0.1)
Basophils Relative: 1 %
Eosinophils Absolute: 0.2 10*3/uL (ref 0.0–0.5)
Eosinophils Relative: 2 %
HCT: 39.3 % (ref 39.0–52.0)
Hemoglobin: 13.1 g/dL (ref 13.0–17.0)
Immature Granulocytes: 0 %
Lymphocytes Relative: 23 %
Lymphs Abs: 2.2 10*3/uL (ref 0.7–4.0)
MCH: 31.1 pg (ref 26.0–34.0)
MCHC: 33.3 g/dL (ref 30.0–36.0)
MCV: 93.3 fL (ref 80.0–100.0)
Monocytes Absolute: 0.9 10*3/uL (ref 0.1–1.0)
Monocytes Relative: 10 %
Neutro Abs: 6.1 10*3/uL (ref 1.7–7.7)
Neutrophils Relative %: 64 %
Platelets: 192 10*3/uL (ref 150–400)
RBC: 4.21 MIL/uL — ABNORMAL LOW (ref 4.22–5.81)
RDW: 11.9 % (ref 11.5–15.5)
WBC: 9.6 10*3/uL (ref 4.0–10.5)
nRBC: 0 % (ref 0.0–0.2)

## 2019-07-21 LAB — PROTIME-INR
INR: 1 (ref 0.8–1.2)
Prothrombin Time: 13.5 seconds (ref 11.4–15.2)

## 2019-07-21 LAB — TROPONIN I (HIGH SENSITIVITY)
Troponin I (High Sensitivity): 8 ng/L (ref <18)
Troponin I (High Sensitivity): 8 ng/L (ref <18)

## 2019-07-21 LAB — ECHOCARDIOGRAM LIMITED
Height: 65 in
Weight: 2560 oz

## 2019-07-21 LAB — HIV ANTIBODY (ROUTINE TESTING W REFLEX): HIV Screen 4th Generation wRfx: NONREACTIVE

## 2019-07-21 LAB — APTT: aPTT: 28 seconds (ref 24–36)

## 2019-07-21 MED ORDER — ACETAMINOPHEN 325 MG PO TABS: 650.0000 mg | ORAL_TABLET | ORAL | Status: DC | PRN

## 2019-07-21 MED ORDER — ONDANSETRON HCL 4 MG/2ML IJ SOLN: 4.0000 mg | Freq: Four times a day (QID) | INTRAMUSCULAR | Status: DC | PRN

## 2019-07-21 MED ORDER — ACETAMINOPHEN 325 MG PO TABS
650.0000 mg | ORAL_TABLET | Freq: Once | ORAL | Status: AC
  Administered 2019-07-21: 08:00:00 650 mg via ORAL
  Filled 2019-07-21: qty 2

## 2019-07-21 MED ORDER — BUPROPION HCL ER (SR) 150 MG PO TB12: 150.0000 mg | ORAL_TABLET | Freq: Every day | ORAL | Status: DC

## 2019-07-21 MED ORDER — LAMOTRIGINE 25 MG PO TABS: 150.0000 mg | ORAL_TABLET | Freq: Every day | ORAL | Status: DC

## 2019-07-21 MED ORDER — METOPROLOL SUCCINATE ER 25 MG PO TB24: 25.0000 mg | ORAL_TABLET | Freq: Every day | ORAL | Status: DC

## 2019-07-21 MED ORDER — ROSUVASTATIN CALCIUM 40 MG PO TABS: 40.0000 mg | ORAL_TABLET | Freq: Every day | ORAL | Status: DC

## 2019-07-21 MED ORDER — SODIUM CHLORIDE 0.9 % IV BOLUS
500.0000 mL | Freq: Once | INTRAVENOUS | Status: AC
  Administered 2019-07-21: 500 mL via INTRAVENOUS

## 2019-07-21 MED ORDER — ALUM & MAG HYDROXIDE-SIMETH 200-200-20 MG/5ML PO SUSP: 30.0000 mL | Freq: Once | ORAL | Status: DC

## 2019-07-21 MED ORDER — ASPIRIN EC 81 MG PO TBEC: 81.0000 mg | DELAYED_RELEASE_TABLET | Freq: Every day | ORAL | Status: DC

## 2019-07-21 MED ORDER — NITROGLYCERIN 0.4 MG SL SUBL: 0.4000 mg | SUBLINGUAL_TABLET | SUBLINGUAL | Status: DC | PRN

## 2019-07-21 MED ORDER — NICOTINE 14 MG/24HR TD PT24: 14.0000 mg | MEDICATED_PATCH | Freq: Every day | TRANSDERMAL | Status: DC

## 2019-07-21 MED ORDER — LIDOCAINE VISCOUS HCL 2 % MT SOLN: 15.0000 mL | Freq: Once | OROMUCOSAL | Status: DC

## 2019-07-21 MED ORDER — IOHEXOL 350 MG/ML SOLN
100.0000 mL | Freq: Once | INTRAVENOUS | Status: AC | PRN
  Administered 2019-07-21: 100 mL via INTRAVENOUS

## 2019-07-21 MED ORDER — HEPARIN SODIUM (PORCINE) 5000 UNIT/ML IJ SOLN: 5000.0000 [IU] | Freq: Three times a day (TID) | INTRAMUSCULAR | Status: DC

## 2019-07-21 MED ORDER — TICAGRELOR 90 MG PO TABS: 90.0000 mg | ORAL_TABLET | Freq: Two times a day (BID) | ORAL | Status: DC

## 2019-07-21 MED ORDER — PANTOPRAZOLE SODIUM 40 MG PO TBEC: 40.0000 mg | DELAYED_RELEASE_TABLET | Freq: Every day | ORAL | Status: DC

## 2019-07-21 MED ORDER — NITROGLYCERIN IN D5W 200-5 MCG/ML-% IV SOLN
5.0000 ug/min | INTRAVENOUS | Status: DC
  Filled 2019-07-21: qty 250

## 2019-07-21 NOTE — Progress Notes (Addendum)
*  PRELIMINARY RESULTS* Echocardiogram 2D Echocardiogram-Limited has been performed.  Joseph Vaughn 07/21/2019, 12:48 PM

## 2019-07-21 NOTE — ED Notes (Signed)
Echo at bedside for exam °

## 2019-07-21 NOTE — ED Provider Notes (Signed)
Care assumed from Dr. Tomi Bamberger at shift change.  Patient awaiting results of a second troponin.  Patient with history of coronary artery disease with stents presenting with complaints of pain in his chest and back for the past 2 nights.  Second troponin has returned and is unremarkable.  Patient care discussed with Dr. Harl Bowie from cardiology.  It is his recommendation that the patient be admitted for observation and possibly further work-up.  I have spoken with Dr. Dyann Kief who agrees to admit.   Veryl Speak, MD 07/21/19 1125

## 2019-07-21 NOTE — ED Notes (Signed)
Patient refused to sign discharge paperwork. Patient's IV was removed and patient shown out of the ED.
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
## 2019-07-21 NOTE — ED Triage Notes (Signed)
Pt c/o chest pain that started around 1:30 AM
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
## 2019-07-21 NOTE — Discharge Summary (Signed)
Physician Discharge Summary  Joseph Vaughn [id number] DOB: November 10, 1955 DOA: 07/21/2019  PCP: Tonia Ghent, MD  Admit date: 07/21/2019 Discharge date: 07/21/2019  Time spent: 25 minutes  Recommendations for Outpatient Follow-up:  Patient left before proper discharge accomplished. -Will recommend repeat basic metabolic panel to follow electrolytes and renal function -Reassess blood pressure and adjust antihypertensive regimen as needed -Patient will need outpatient follow-up with cardiology service.    Discharge Diagnoses:  Active Problems:   Chest pain   Discharge Condition: Patient left before being proper discharge; no prescriptions or instructions able to be provided.  Filed Weights   07/21/19 0442  Weight: 72.6 kg    History of present illness:  64 y.o. male with past medical history significant for hypertension, hyperlipidemia, chronic kidney disease a stage III a, coronary artery disease (status post drug-eluting stent and distal left circumflex), tobacco abuse and bipolar disorder; who presented to the hospital secondary to chest discomfort.  Patient expressed experiencing intermittent episode of back discomfort with a subsequent radiation to his left anterior chest pain while walking up early this morning.  Patient reported some mild shortness of breath while the pain was present.  He took some aspirin nitroglycerin without significant relief of the discomfort and decided to come to the emergency department for further evaluation.  Another dose of nitroglycerin was provided will work-up present and after approximately 3 hours or so pain is spontaneously resolved and he is chest pain-free at this time.  Patient denies nausea, vomiting, abdominal pain, dysuria, hematuria, melena, hematochezia, headaches, blurred vision or focal weakness.  When present the pain was 6-8 out of 10 in intensity, mildy radiated to his left costal, as mentioned above with association of shortness of  breath and present and without any other alleviating or aggravating factors.  Patient was at rest when the pain started. In the ED work-up demonstrated negative troponin, no acute ischemic changes on EKG and normal chest x-ray.  Creatinine was slightly elevated for his baseline.  Cardiology service was consulted with recommendation for further evaluation and to check 2D echo for stratification.  TRH has been called admit patient for further evaluation and management.  Hospital Course:  1-chest pain -Patient with a heart score of 4-5; atypical presentation. -Initial set of troponin negative; EKG with unchanged chronic left bundle branch block. -After receiving nitroglycerin and pain medication he is chest pain-free currently -Given his risk factors and prior history of coronary artery disease, case was discussed with cardiology service (Dr. Harl Bowie) who recommended further evaluation and echocardiogram. -Echo returned with reassurance demonstrating, preserved ejection fraction and no wall motion abnormalities. -Plan was to send patient home with outpatient follow-up; he became impatient and decided to leave before appropriate discharge.  *Rest of his medical problems as mentioned below based on H&P earlier written.  2-essential hypertension -Appears stable and well-controlled -Continue current antihypertensive regimen.  3-hyperlipidemia -Will check lipid panel -Continue the use of Crestor  4-history of tobacco abuse -Cessation counseling has been provided -Patient expressed significant decrease in his smoking amount and at this time declined the need of nicotine patch. -I have discussed tobacco cessation with the patient. I have counseled the patient regarding the negative impacts of continued tobacco use including but not limited to lung cancer, COPD, and cardiovascular disease. I have discussed alternatives to tobacco and modalities that may help facilitate tobacco cessation including  but not limited to biofeedback, hypnosis, and medications. Total time spent with tobacco counseling was 5 minutes.  5-history of depression/anxiety -  No suicidal ideation or hallucinations -Mood is a stable -Continue bupropion and lamictal.  6-chronic kidney disease a stage III -Appears to be associated with history of hypertension -Stage IIIa at baseline -Creatinine 1.3 to 1.4 at baseline -currently 1.47 -Patient received gentle fluid resuscitation while in the ED -Advised to maintain adequate hydration -Will follow basic metabolic panel -Minimize the use of nephrotoxic agents.   Procedures:  Echo: preserved EF, normal EF.  See below for x-ray reports.  Consultations:  Cardiology   Discharge Exam: Vitals:   07/21/19 1030 07/21/19 1400  BP:    Pulse: (!) 56   Resp: 14 14  Temp:    SpO2: 98%     General: Patient remains chest pain-free and otherwise stable.  He had reassuring 2D echo and after evaluated by cardiology service recommendations given to discharge home with outpatient follow-up.  Prior to my ability to discharge patient he decided to leave prior to proper discharge.  Discharge Instructions    Allergies as of 07/21/2019      Reactions   Lipitor [atorvastatin] Other (See Comments)   myalgias      Medication List    ASK your doctor about these medications   acetaminophen 500 MG tablet Commonly known as: TYLENOL Take 1,000 mg by mouth every 6 (six) hours as needed for mild pain or headache.   aspirin 81 MG EC tablet Take 1 tablet (81 mg total) by mouth daily.   Brilinta 90 MG Tabs tablet Generic drug: ticagrelor TAKE 1 TABLET BY MOUTH TWICE A DAY   buPROPion 150 MG 24 hr tablet Commonly known as: WELLBUTRIN XL Take 150 mg by mouth daily. Ask about: Which instructions should I use?   lamoTRIgine 150 MG tablet Commonly known as: LAMICTAL Take 150 mg by mouth daily.   metoprolol succinate 25 MG 24 hr tablet Commonly known as:  TOPROL-XL Take 1 tablet (25 mg total) by mouth daily.   nitroGLYCERIN 0.4 MG SL tablet Commonly known as: NITROSTAT Place 1 tablet (0.4 mg total) under the tongue every 5 (five) minutes as needed for chest pain (CP or SOB).   rosuvastatin 40 MG tablet Commonly known as: CRESTOR TAKE 1 TABLET BY MOUTH EVERY DAY AT 6PM      Allergies  Allergen Reactions  . Lipitor [Atorvastatin] Other (See Comments)    myalgias   Follow-up Information    Erma Heritage, PA-C Follow up.   Specialties: Physician Assistant, Cardiology Why: Cardiology Follow-up on 08/12/2019 at 3:00 with Bernerd Pho, PA-C (works with Dr. Johnsie Cancel). If needing a different date or time, please contact our office. Appt at [place].  Contact information: [address] [place] [address] [phone]           The results of significant diagnostics from this hospitalization (including imaging, microbiology, ancillary and laboratory) are listed below for reference.    Significant Diagnostic Studies: CT Angio Chest/Abd/Pel for Dissection W and/or W/WO  Result Date: 07/21/2019 CLINICAL DATA:  Chest pain radiating to the back EXAM: CT ANGIOGRAPHY CHEST, ABDOMEN AND PELVIS TECHNIQUE: Multidetector CT imaging through the chest, abdomen and pelvis was performed using the standard protocol during bolus administration of intravenous contrast. Multiplanar reconstructed images and MIPs were obtained and reviewed to evaluate the vascular anatomy. CONTRAST:  179mL OMNIPAQUE IOHEXOL 350 MG/ML SOLN COMPARISON:  06/17/2018 chest CT FINDINGS: CTA CHEST FINDINGS Cardiovascular: Preferential opacification of the thoracic aorta. No evidence of thoracic aortic aneurysm or dissection. No intramural hematoma on noncontrast phase. Aortic and  coronary atherosclerotic calcification. Normal heart size. No pericardial effusion. No visible pulmonary artery filling defect Mediastinum/Nodes: Negative for adenopathy or pneumomediastinum.  Negative esophagus Lungs/Pleura: Mild dependent atelectasis. There is no edema, consolidation, effusion, or pneumothorax. Small calcified nodules in the lower lungs. Musculoskeletal: Spondylosis.  No acute or aggressive finding. Review of the MIP images confirms the above findings. CTA ABDOMEN AND PELVIS FINDINGS VASCULAR Aorta: Diffuse atheromatous plaque. No aneurysm or dissection. No surrounding inflammation. Celiac: Negative SMA: Negative when accounting for motion Renals: Single bilateral renal arteries. There is plaque at the ostia without flow limiting stenosis. IMA: Patent Inflow: Negative for aneurysm or dissection. Mild atheromatous changes Veins: Negative Review of the MIP images confirms the above findings. NON-VASCULAR Hepatobiliary: No focal liver abnormality.No evidence of biliary obstruction or stone. Pancreas: Unremarkable. Spleen: Unremarkable. Adrenals/Urinary Tract: Negative adrenals. No hydronephrosis or ureteral stone. Branching left lower pole renal calculus measuring up to 21 mm on reformats. Unremarkable bladder. Stomach/Bowel: No obstruction. No pericecal or other visible bowel inflammation. Lymphatic: No acute vascular abnormality.  No mass or adenopathy. Reproductive:No pathologic findings. Other: No ascites or pneumoperitoneum. Musculoskeletal: No acute abnormalities. Review of the MIP images confirms the above findings. IMPRESSION: 1. No acute finding including evidence of acute aortic syndrome. 2.  Aortic Atherosclerosis (ICD10-I70.0).  Coronary atherosclerosis. 3. Large and branching left renal calculus. Electronically Signed   By: Monte Fantasia M.D.   On: 07/21/2019 06:51   ECHOCARDIOGRAM LIMITED  Result Date: 07/21/2019    ECHOCARDIOGRAM LIMITED REPORT   Patient Name:   Joseph Vaughn Date of Exam: 07/21/2019 Medical Rec #:  [id number]         Height:       65.0 in Accession #:    [id number]        Weight:       160.0 lb Date of Birth:  [birth year]         BSA:          1.80 m  Patient Age:    22 years          BP:           124/71 mmHg Patient Gender: M                 HR:           71 bpm. Exam Location:  [facility] Procedure: Limited Echo, Cardiac Doppler and Color Doppler Indications:    Chest Pain 786.50 / R07.9  History:        Patient has prior history of Echocardiogram examinations, most                 recent 06/01/2018. Previous Myocardial Infarction and CAD,                 Arrythmias:LBBB; Risk Factors:Dyslipidemia. S/P cardiac cath                 08/13/18 stable cath with patent stents.  Sonographer:    BW Referring Phys: [id number] [place]  1. Left ventricular ejection fraction, by estimation, is 55 to 60%. The left ventricle has normal function. The left ventrical has no regional wall motion abnormalities. There is mildly increased left ventricular hypertrophy. Left ventricular diastolic parameters are consistent with Grade I diastolic dysfunction (impaired relaxation).  2. Right ventricular systolic function is normal. The right ventricular size is normal. There is mildly elevated pulmonary artery systolic pressure.  3. Mild mitral valve regurgitation.  4. The aortic valve  is tricuspid.  5. Limited echo to evaluate LV function FINDINGS  Left Ventricle: Left ventricular ejection fraction, by estimation, is 55 to 60%. The left ventricle has normal function. The left ventricle has no regional wall motion abnormalities. The left ventricular internal cavity size was normal in size. There is  mildly increased left ventricular hypertrophy. Right Ventricle: The right ventricular size is normal. No increase in right ventricular wall thickness. Right ventricular systolic function is normal. There is mildly elevated pulmonary artery systolic pressure. The tricuspid regurgitant velocity is 2.33  m/s, and with an assumed right atrial pressure of 10 mmHg, the estimated right ventricular systolic pressure is [id number] mmHg. Pericardium: There is no evidence of pericardial  effusion. Mitral Valve: The mitral valve is normal in structure and function. Mild mitral valve regurgitation. No evidence of mitral valve stenosis. Tricuspid Valve: The tricuspid valve is normal in structure. Tricuspid valve regurgitation is mild . No evidence of tricuspid stenosis. Aortic Valve: The aortic valve is tricuspid. Aorta: The aortic root is normal in size and structure.  LEFT VENTRICLE PLAX 2D LVIDd:         4.14 cm LVIDs:         2.94 cm LV PW:         1.12 cm LV IVS:        1.21 cm LVOT diam:     1.80 cm LV SV Index:   23.13 LVOT Area:     2.54 cm  RIGHT VENTRICLE TAPSE (M-mode): 2.4 cm LEFT ATRIUM         Index LA diam:    3.10 cm 1.72 cm/m   AORTA Ao Root diam: 2.70 cm MITRAL VALVE                        TRICUSPID VALVE MV Area (PHT): 3.99 cm             TR Peak grad:   21.7 mmHg MV Decel Time: 190 msec             TR Vmax:        233.00 cm/s MV E velocity: 65.50 cm/s 103 cm/s MV A velocity: 93.20 cm/s 70.3 cm/s SHUNTS MV E/A ratio:  0.70       1.5       Systemic Diam: 1.80 cm Carlyle Dolly MD Electronically signed by Carlyle Dolly MD Signature Date/Time: 07/21/2019/1:18:59 PM    Final     Microbiology: No results found for this or any previous visit (from the past 240 hour(s)).   Labs: Basic Metabolic Panel: Recent Labs  Lab 07/21/19 0542  NA 139  K 4.1  CL 103  CO2 27  GLUCOSE 95  BUN 15  CREATININE 1.47*  CALCIUM 9.6   Liver Function Tests: Recent Labs  Lab 07/21/19 0542  AST 56*  ALT 50*  ALKPHOS 63  BILITOT 0.7  PROT 7.0  ALBUMIN 4.0   CBC: Recent Labs  Lab 07/21/19 0542  WBC 9.6  NEUTROABS 6.1  HGB 13.1  HCT 39.3  MCV 93.3  PLT 192    BNP (last 3 results) Recent Labs    08/12/18 2022  BNP 15.1     Signed:  Barton Dubois MD.  [facility] 07/21/2019, 10:23 PM

## 2019-07-21 NOTE — ED Notes (Signed)
Pt was offered a lunch tray. Pt said "no" then "thank you"

## 2019-07-21 NOTE — ED Notes (Signed)
Patient stated he is not staying any longer. He requested his IV removed and left because he was tired of waiting and left AMA.
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
## 2019-08-12 ENCOUNTER — Ambulatory Visit: Payer: BC Managed Care – PPO | Admitting: Student

## 2019-08-24 ENCOUNTER — Other Ambulatory Visit: Payer: BC Managed Care – PPO | Admitting: *Deleted

## 2019-08-24 ENCOUNTER — Other Ambulatory Visit: Payer: Self-pay

## 2019-08-24 DIAGNOSIS — E785 Hyperlipidemia, unspecified: Secondary | ICD-10-CM | POA: Diagnosis not present

## 2019-08-24 DIAGNOSIS — I2510 Atherosclerotic heart disease of native coronary artery without angina pectoris: Secondary | ICD-10-CM

## 2019-08-25 LAB — LIPID PANEL
Chol/HDL Ratio: 3 ratio (ref 0.0–5.0)
Cholesterol, Total: 105 mg/dL (ref 100–199)
HDL: 35 mg/dL — ABNORMAL LOW (ref >39)
LDL Chol Calc (NIH): 50 mg/dL (ref 0–99)
Triglycerides: 109 mg/dL (ref 0–149)
VLDL Cholesterol Cal: 20 mg/dL (ref 5–40)

## 2019-08-25 LAB — HEPATIC FUNCTION PANEL
ALT: 48 IU/L — ABNORMAL HIGH (ref 0–44)
AST: 43 IU/L — ABNORMAL HIGH (ref 0–40)
Albumin: 4.6 g/dL (ref 3.8–4.8)
Alkaline Phosphatase: 87 IU/L (ref 39–117)
Bilirubin Total: 0.3 mg/dL (ref 0.0–1.2)
Bilirubin, Direct: 0.1 mg/dL (ref 0.00–0.40)
Total Protein: 6.8 g/dL (ref 6.0–8.5)

## 2019-08-27 ENCOUNTER — Telehealth: Payer: Self-pay

## 2019-08-27 DIAGNOSIS — E785 Hyperlipidemia, unspecified: Secondary | ICD-10-CM

## 2019-08-27 NOTE — Telephone Encounter (Signed)
-----   Message from Josue Hector, MD sent at 08/26/2019 11:41 PM EDT ----- Chronic mild elevation in LFTls stable LDL good f/u labs in 6 months

## 2019-08-27 NOTE — Telephone Encounter (Signed)
Patient aware of results. Per Dr. Johnsie Cancel, Chronic mild elevation in LFTls stable LDL good f/u labs in 6 months. Patient verbalized understanding. Patient will come in on 02/29/20.

## 2020-01-19 ENCOUNTER — Other Ambulatory Visit: Payer: Self-pay | Admitting: Cardiovascular Disease

## 2020-02-13 ENCOUNTER — Other Ambulatory Visit: Payer: Self-pay | Admitting: Cardiovascular Disease

## 2020-02-29 ENCOUNTER — Other Ambulatory Visit: Payer: BC Managed Care – PPO | Admitting: *Deleted

## 2020-02-29 ENCOUNTER — Other Ambulatory Visit: Payer: Self-pay

## 2020-02-29 DIAGNOSIS — E785 Hyperlipidemia, unspecified: Secondary | ICD-10-CM

## 2020-02-29 LAB — LIPID PANEL
Chol/HDL Ratio: 3.1 ratio (ref 0.0–5.0)
Cholesterol, Total: 108 mg/dL (ref 100–199)
HDL: 35 mg/dL — ABNORMAL LOW (ref >39)
LDL Chol Calc (NIH): 47 mg/dL (ref 0–99)
Triglycerides: 153 mg/dL — ABNORMAL HIGH (ref 0–149)
VLDL Cholesterol Cal: 26 mg/dL (ref 5–40)

## 2020-02-29 LAB — HEPATIC FUNCTION PANEL
ALT: 24 IU/L (ref 0–44)
AST: 26 IU/L (ref 0–40)
Albumin: 4.2 g/dL (ref 3.8–4.8)
Alkaline Phosphatase: 73 IU/L (ref 44–121)
Bilirubin Total: 0.3 mg/dL (ref 0.0–1.2)
Bilirubin, Direct: <0.1 mg/dL (ref 0.00–0.40)
Total Protein: 6.6 g/dL (ref 6.0–8.5)

## 2020-03-16 DIAGNOSIS — S62112A Displaced fracture of triquetrum [cuneiform] bone, left wrist, initial encounter for closed fracture: Secondary | ICD-10-CM | POA: Diagnosis not present

## 2020-03-16 DIAGNOSIS — M25532 Pain in left wrist: Secondary | ICD-10-CM | POA: Diagnosis not present

## 2020-03-25 ENCOUNTER — Ambulatory Visit: Payer: BC Managed Care – PPO | Admitting: Family Medicine

## 2020-03-31 ENCOUNTER — Ambulatory Visit (INDEPENDENT_AMBULATORY_CARE_PROVIDER_SITE_OTHER): Payer: BC Managed Care – PPO | Admitting: Family Medicine

## 2020-03-31 ENCOUNTER — Other Ambulatory Visit: Payer: Self-pay

## 2020-03-31 ENCOUNTER — Encounter: Payer: Self-pay | Admitting: Family Medicine

## 2020-03-31 VITALS — BP 132/74 | HR 78 | Temp 97.0°F | Ht 65.0 in | Wt 164.2 lb

## 2020-03-31 DIAGNOSIS — K921 Melena: Secondary | ICD-10-CM

## 2020-03-31 NOTE — Progress Notes (Signed)
This visit occurred during the SARS-CoV-2 public health emergency.  Safety protocols were in place, including screening questions prior to the visit, additional usage of staff PPE, and extensive cleaning of exam room while observing appropriate contact time as indicated for disinfecting solutions.  Lipids improved, d/w pt.    He retired 2020.  His diet clearly improved in the meantime.  He isn't eating fast food.  He is happy about retirement.    Blood seen in stool.  He is color blind but it looked like clotted blood.  He had had some antecedent abd bloating and lower back pain (unclear if he had a kidney stone concurrently).  He had L lower abd pain- this had happened prev but this was longer than typical.  He had some diarrhea, ongoing today (after etoh consumption last night).    He just found out he has a distant relative with colon problems and his sister has UC.    No blood in stool in the last week.  No CP.  No abd pain now.   Encourage Covid vaccination.  Discussed.  Meds, vitals, and allergies reviewed.   ROS: Per HPI unless specifically indicated in ROS section   GEN: nad, alert and oriented HEENT: ncat NECK: supple w/o LA CV: rrr.   PULM: ctab, no inc wob ABD: soft, +bs, not ttp EXT: no edema SKIN: no acute rash

## 2020-03-31 NOTE — Patient Instructions (Signed)
Go to the lab on the way out.   If you have mychart we'll likely use that to update you.    We'll call about seeing the GI clinic.  Take care.  Glad to see you.

## 2020-04-01 LAB — CBC WITH DIFFERENTIAL/PLATELET
Basophils Absolute: 0.1 10*3/uL (ref 0.0–0.1)
Basophils Relative: 1.1 % (ref 0.0–3.0)
Eosinophils Absolute: 0.2 10*3/uL (ref 0.0–0.7)
Eosinophils Relative: 2.1 % (ref 0.0–5.0)
HCT: 40.3 % (ref 39.0–52.0)
Hemoglobin: 13.5 g/dL (ref 13.0–17.0)
Lymphocytes Relative: 15.1 % (ref 12.0–46.0)
Lymphs Abs: 1.7 10*3/uL (ref 0.7–4.0)
MCHC: 33.5 g/dL (ref 30.0–36.0)
MCV: 91 fl (ref 78.0–100.0)
Monocytes Absolute: 1.1 10*3/uL — ABNORMAL HIGH (ref 0.1–1.0)
Monocytes Relative: 9.9 % (ref 3.0–12.0)
Neutro Abs: 7.9 10*3/uL — ABNORMAL HIGH (ref 1.4–7.7)
Neutrophils Relative %: 71.8 % (ref 43.0–77.0)
Platelets: 281 10*3/uL (ref 150.0–400.0)
RBC: 4.43 Mil/uL (ref 4.22–5.81)
RDW: 13.7 % (ref 11.5–15.5)
WBC: 11.1 10*3/uL — ABNORMAL HIGH (ref 4.0–10.5)

## 2020-04-03 DIAGNOSIS — K921 Melena: Secondary | ICD-10-CM | POA: Insufficient documentation

## 2020-04-03 NOTE — Assessment & Plan Note (Signed)
Presumed blood in stool.  Benign abdominal exam today.  See notes on labs.  Refer to GI.  Routine cautions given to patient.  See above.  He agrees.
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
## 2020-05-16 ENCOUNTER — Ambulatory Visit: Payer: BC Managed Care – PPO | Admitting: Cardiovascular Disease

## 2020-06-07 ENCOUNTER — Other Ambulatory Visit: Payer: Self-pay | Admitting: Cardiovascular Disease
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
## 2020-06-29 ENCOUNTER — Ambulatory Visit: Payer: BC Managed Care – PPO | Admitting: Gastroenterology

## 2020-07-07 ENCOUNTER — Other Ambulatory Visit: Payer: Self-pay

## 2020-07-07 ENCOUNTER — Ambulatory Visit (INDEPENDENT_AMBULATORY_CARE_PROVIDER_SITE_OTHER): Payer: BC Managed Care – PPO | Admitting: Cardiovascular Disease

## 2020-07-07 ENCOUNTER — Encounter: Payer: Self-pay | Admitting: Cardiovascular Disease

## 2020-07-07 VITALS — BP 114/74 | HR 73 | Ht 65.0 in | Wt 168.0 lb

## 2020-07-07 DIAGNOSIS — I2510 Atherosclerotic heart disease of native coronary artery without angina pectoris: Secondary | ICD-10-CM

## 2020-07-07 NOTE — Patient Instructions (Signed)

## 2020-07-15 ENCOUNTER — Other Ambulatory Visit: Payer: Self-pay | Admitting: Physician Assistant

## 2020-07-20 ENCOUNTER — Other Ambulatory Visit: Payer: Self-pay | Admitting: Cardiovascular Disease

## 2020-08-05 ENCOUNTER — Encounter: Payer: Self-pay | Admitting: Gastroenterology

## 2020-08-05 ENCOUNTER — Telehealth: Payer: Self-pay | Admitting: *Deleted

## 2020-08-05 ENCOUNTER — Ambulatory Visit: Payer: BC Managed Care – PPO | Admitting: Gastroenterology

## 2020-08-05 ENCOUNTER — Other Ambulatory Visit: Payer: Self-pay

## 2020-08-05 VITALS — BP 118/72 | HR 60 | Ht 65.0 in | Wt 169.0 lb

## 2020-08-05 DIAGNOSIS — K625 Hemorrhage of anus and rectum: Secondary | ICD-10-CM

## 2020-08-05 DIAGNOSIS — R1032 Left lower quadrant pain: Secondary | ICD-10-CM

## 2020-08-05 DIAGNOSIS — K219 Gastro-esophageal reflux disease without esophagitis: Secondary | ICD-10-CM

## 2020-08-05 MED ORDER — PLENVU 140 G PO SOLR: 1.0000 | Freq: Once | ORAL | 0 refills | Status: AC

## 2020-08-05 NOTE — Telephone Encounter (Signed)
   Primary Cardiologist: Jenkins Rouge, MD  Chart reviewed as part of pre-operative protocol coverage. Given past medical history and time since last visit, based on ACC/AHA guidelines, Joseph Vaughn would be at acceptable risk for the planned procedure without further cardiovascular testing.  He may hold Brilinta for 5 days prior to the procedure and restart as soon as possible after the procedure at the surgeon's discretion  The patient was advised that if he develops new symptoms prior to surgery to contact our office to arrange for a follow-up visit, and he verbalized understanding.  I will route this recommendation to the requesting party via Epic fax function and remove from pre-op pool.  Please call with questions.  [place], [place] 08/05/2020, 10:53 AM

## 2020-08-05 NOTE — Patient Instructions (Addendum)
You have been scheduled for an endoscopy and colonoscopy. Please follow the written instructions given to you at your visit today. Please pick up your prep supplies at the pharmacy within the next 1-3 days. If you use inhalers (even only as needed), please bring them with you on the day of your procedure  You will be contacted by our office prior to your procedure for directions on holding your Brillinta .  If you do not hear from our office 1 week prior to your scheduled procedure, please call [phone] to discuss. .   Due to recent changes in healthcare laws, you may see the results of your imaging and laboratory studies on MyChart before your provider has had a chance to review them.  We understand that in some cases there may be results that are confusing or concerning to you. Not all laboratory results come back in the same time frame and the provider may be waiting for multiple results in order to interpret others.  Please give [place] 48 hours in order for your provider to thoroughly review all the results before contacting the office for clarification of your results.    Conn's Current Therapy 2021 (pp. 213-216). [place], [place]: Elsevier.">  Gastroesophageal Reflux Disease, Adult Gastroesophageal reflux (GER) happens when acid from the stomach flows up into the tube that connects the mouth and the stomach (esophagus). Normally, food travels down the esophagus and stays in the stomach to be digested. However, when a person has GER, food and stomach acid sometimes move back up into the esophagus. If this becomes a more serious problem, the person may be diagnosed with a disease called gastroesophageal reflux disease (GERD). GERD occurs when the reflux:  Happens often.  Causes frequent or severe symptoms.  Causes problems such as damage to the esophagus. When stomach acid comes in contact with the esophagus, the acid may cause inflammation in the esophagus. Over time, GERD may create small  holes (ulcers) in the lining of the esophagus. What are the causes? This condition is caused by a problem with the muscle between the esophagus and the stomach (lower esophageal sphincter, or LES). Normally, the LES muscle closes after food passes through the esophagus to the stomach. When the LES is weakened or abnormal, it does not close properly, and that allows food and stomach acid to go back up into the esophagus. The LES can be weakened by certain dietary substances, medicines, and medical conditions, including:  Tobacco use.  Pregnancy.  Having a hiatal hernia.  Alcohol use.  Certain foods and beverages, such as coffee, chocolate, onions, and peppermint. What increases the risk? You are more likely to develop this condition if you:  Have an increased body weight.  Have a connective tissue disorder.  Take NSAIDs, such as ibuprofen. What are the signs or symptoms? Symptoms of this condition include:  Heartburn.  Difficult or painful swallowing and the feeling of having a lump in the throat.  A bitter taste in the mouth.  Bad breath and having a large amount of saliva.  Having an upset or bloated stomach and belching.  Chest pain. Different conditions can cause chest pain. Make sure you see your health care provider if you experience chest pain.  Shortness of breath or wheezing.  Ongoing (chronic) cough or a nighttime cough.  Wearing away of tooth enamel.  Weight loss. How is this diagnosed? This condition may be diagnosed based on a medical history and a physical exam. To determine if you have mild  or severe GERD, your health care provider may also monitor how you respond to treatment. You may also have tests, including:  A test to examine your stomach and esophagus with a small camera (endoscopy).  A test that measures the acidity level in your esophagus.  A test that measures how much pressure is on your esophagus.  A barium swallow or modified barium  swallow test to show the shape, size, and functioning of your esophagus. How is this treated? Treatment for this condition may vary depending on how severe your symptoms are. Your health care provider may recommend:  Changes to your diet.  Medicine.  Surgery. The goal of treatment is to help relieve your symptoms and to prevent complications. Follow these instructions at home: Eating and drinking  Follow a diet as recommended by your health care provider. This may involve avoiding foods and drinks such as: ? Coffee and tea, with or without caffeine. ? Drinks that contain alcohol. ? Energy drinks and sports drinks. ? Carbonated drinks or sodas. ? Chocolate and cocoa. ? Peppermint and mint flavorings. ? Garlic and onions. ? Horseradish. ? Spicy and acidic foods, including peppers, chili powder, curry powder, vinegar, hot sauces, and barbecue sauce. ? Citrus fruit juices and citrus fruits, such as oranges, lemons, and limes. ? Tomato-based foods, such as red sauce, chili, salsa, and pizza with red sauce. ? Fried and fatty foods, such as donuts, french fries, potato chips, and high-fat dressings. ? High-fat meats, such as hot dogs and fatty cuts of red and white meats, such as rib eye steak, sausage, ham, and bacon. ? High-fat dairy items, such as whole milk, butter, and cream cheese.  Eat small, frequent meals instead of large meals.  Avoid drinking large amounts of liquid with your meals.  Avoid eating meals during the 2-3 hours before bedtime.  Avoid lying down right after you eat.  Do not exercise right after you eat.   Lifestyle  Do not use any products that contain nicotine or tobacco. These products include cigarettes, chewing tobacco, and vaping devices, such as e-cigarettes. If you need help quitting, ask your health care provider.  Try to reduce your stress by using methods such as yoga or meditation. If you need help reducing stress, ask your health care  provider.  If you are overweight, reduce your weight to an amount that is healthy for you. Ask your health care provider for guidance about a safe weight loss goal.   General instructions  Pay attention to any changes in your symptoms.  Take over-the-counter and prescription medicines only as told by your health care provider. Do not take aspirin, ibuprofen, or other NSAIDs unless your health care provider told you to take these medicines.  Wear loose-fitting clothing. Do not wear anything tight around your waist that causes pressure on your abdomen.  Raise (elevate) the head of your bed about 6 inches (15 cm). You can use a wedge to do this.  Avoid bending over if this makes your symptoms worse.  Keep all follow-up visits. This is important. Contact a health care provider if:  You have: ? New symptoms. ? Unexplained weight loss. ? Difficulty swallowing or it hurts to swallow. ? Wheezing or a persistent cough. ? A hoarse voice.  Your symptoms do not improve with treatment. Get help right away if:  You have sudden pain in your arms, neck, jaw, teeth, or back.  You suddenly feel sweaty, dizzy, or light-headed.  You have chest pain or shortness  of breath.  You vomit and the vomit is green, yellow, or black, or it looks like blood or coffee grounds.  You faint.  You have stool that is red, bloody, or black.  You cannot swallow, drink, or eat. These symptoms may represent a serious problem that is an emergency. Do not wait to see if the symptoms will go away. Get medical help right away. Call your local emergency services (911 in the [place] Do not drive yourself to the hospital. Summary  Gastroesophageal reflux happens when acid from the stomach flows up into the esophagus. GERD is a disease in which the reflux happens often, causes frequent or severe symptoms, or causes problems such as damage to the esophagus.  Treatment for this condition may vary depending on how severe  your symptoms are. Your health care provider may recommend diet and lifestyle changes, medicine, or surgery.  Contact a health care provider if you have new or worsening symptoms.  Take over-the-counter and prescription medicines only as told by your health care provider. Do not take aspirin, ibuprofen, or other NSAIDs unless your health care provider told you to do so.  Keep all follow-up visits as told by your health care provider. This is important. This information is not intended to replace advice given to you by your health care provider. Make sure you discuss any questions you have with your health care provider. Document Revised: 12/07/2019 Document Reviewed: 12/07/2019 Elsevier Patient Education  [place].   I appreciate the  opportunity to care for you  Thank You   Harl Bowie , MD

## 2020-08-05 NOTE — Telephone Encounter (Signed)
[facility] Pre-operative Risk Assessment     Request for surgical clearance:     Endoscopy Procedure  What type of surgery is being performed?     EGD Colon  When is this surgery scheduled?     09/09/2020  What type of clearance is required ?   Pharmacy  Are there any medications that need to be held prior to surgery and how long? [place] name and name of physician performing surgery?      [facility]  Harl Bowie What is your office phone and fax number?      Phone- [phone]  [phone]  Anesthesia type (None, local, MAC, general) ?       MAC

## 2020-08-05 NOTE — Telephone Encounter (Signed)
Ok to hold brilinta for 5 days
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
## 2020-08-05 NOTE — Telephone Encounter (Signed)
Dr. Johnsie Cancel, please comment on whether the patient can hold Brilinta for 5 days prior to GI procedure.   I spoke with Joseph Vaughn over the phone.  Since his last office visit with you last month, he has not had any chest pain or worsening shortness of breath.  Overall GI procedure is considered very low risk.  Please forward your response to [facility]

## 2020-08-09 NOTE — Telephone Encounter (Signed)
Patient aware it is ok for him to hold Brillinta 5 days before procedure

## 2020-08-24 ENCOUNTER — Encounter: Payer: Self-pay | Admitting: Gastroenterology

## 2020-08-31 ENCOUNTER — Other Ambulatory Visit: Payer: Self-pay | Admitting: Cardiovascular Disease

## 2020-09-06 ENCOUNTER — Telehealth: Payer: Self-pay | Admitting: Gastroenterology

## 2020-09-06 DIAGNOSIS — K625 Hemorrhage of anus and rectum: Secondary | ICD-10-CM

## 2020-09-06 MED ORDER — SUTAB 1479-225-188 MG PO TABS: ORAL_TABLET | ORAL | 0 refills | Status: DC

## 2020-09-06 NOTE — Telephone Encounter (Signed)
pt states he does not have plenvu yet  his procedure is on Friday 4-1.   Pharmacy said it would be in on Mon it was still unavailable please call pharmacy  to see if in stock, if  not please call in something else in  for patient. Please call patient with new instructions and when to pick up.

## 2020-09-06 NOTE — Telephone Encounter (Signed)
Spoke with pt.  He does not have mychart and would not be able to drive to [place] to get new instructions.  I emailed new instructions to him and reviewed them with him on the phone.  Sutab rx sent to his pharmacy.  Pt instructed to call back if he has any further questions

## 2020-09-09 ENCOUNTER — Encounter: Payer: Self-pay | Admitting: Gastroenterology

## 2020-09-09 ENCOUNTER — Telehealth: Payer: Self-pay | Admitting: *Deleted

## 2020-09-09 ENCOUNTER — Ambulatory Visit (AMBULATORY_SURGERY_CENTER): Payer: BC Managed Care – PPO | Admitting: Gastroenterology

## 2020-09-09 ENCOUNTER — Other Ambulatory Visit: Payer: Self-pay

## 2020-09-09 VITALS — BP 99/64 | HR 64 | Temp 97.3°F | Resp 18 | Ht 65.0 in | Wt 169.0 lb

## 2020-09-09 DIAGNOSIS — K635 Polyp of colon: Secondary | ICD-10-CM | POA: Diagnosis not present

## 2020-09-09 DIAGNOSIS — K259 Gastric ulcer, unspecified as acute or chronic, without hemorrhage or perforation: Secondary | ICD-10-CM

## 2020-09-09 DIAGNOSIS — D122 Benign neoplasm of ascending colon: Secondary | ICD-10-CM

## 2020-09-09 DIAGNOSIS — D120 Benign neoplasm of cecum: Secondary | ICD-10-CM

## 2020-09-09 DIAGNOSIS — K2970 Gastritis, unspecified, without bleeding: Secondary | ICD-10-CM | POA: Diagnosis not present

## 2020-09-09 DIAGNOSIS — D125 Benign neoplasm of sigmoid colon: Secondary | ICD-10-CM

## 2020-09-09 DIAGNOSIS — K2100 Gastro-esophageal reflux disease with esophagitis, without bleeding: Secondary | ICD-10-CM | POA: Diagnosis not present

## 2020-09-09 DIAGNOSIS — K625 Hemorrhage of anus and rectum: Secondary | ICD-10-CM

## 2020-09-09 DIAGNOSIS — D128 Benign neoplasm of rectum: Secondary | ICD-10-CM | POA: Diagnosis not present

## 2020-09-09 DIAGNOSIS — D124 Benign neoplasm of descending colon: Secondary | ICD-10-CM

## 2020-09-09 DIAGNOSIS — D123 Benign neoplasm of transverse colon: Secondary | ICD-10-CM

## 2020-09-09 DIAGNOSIS — K2980 Duodenitis without bleeding: Secondary | ICD-10-CM

## 2020-09-09 DIAGNOSIS — K2950 Unspecified chronic gastritis without bleeding: Secondary | ICD-10-CM | POA: Diagnosis not present

## 2020-09-09 DIAGNOSIS — K219 Gastro-esophageal reflux disease without esophagitis: Secondary | ICD-10-CM

## 2020-09-09 MED ORDER — SODIUM CHLORIDE 0.9 % IV SOLN: 500.0000 mL | Freq: Once | INTRAVENOUS | Status: DC

## 2020-09-09 MED ORDER — PANTOPRAZOLE SODIUM 40 MG PO TBEC: 40.0000 mg | DELAYED_RELEASE_TABLET | Freq: Every day | ORAL | 11 refills | Status: DC

## 2020-09-09 MED ORDER — SUCRALFATE 1 G PO TABS: 1.0000 g | ORAL_TABLET | Freq: Four times a day (QID) | ORAL | 1 refills | Status: DC

## 2020-09-09 NOTE — Progress Notes (Signed)
VS by Imogene  Pt's states no medical or surgical changes since previsit or office visit.  

## 2020-09-09 NOTE — Op Note (Signed)
[facility] Patient Name: Joseph Vaughn Procedure Date: 09/09/2020 2:12 PM MRN: [id number] Endoscopist: Mauri Pole , MD Age: 65 Referring MD:  Date of Birth: 12-Jul-1955 Gender: Male Account #: [id number] Procedure:                Colonoscopy Indications:              Evaluation of unexplained GI bleeding presenting                            with Hematochezia, Abdominal pain in the left lower                            quadrant Medicines:                Monitored Anesthesia Care Procedure:                Pre-Anesthesia Assessment:                           - Prior to the procedure, a History and Physical                            was performed, and patient medications and                            allergies were reviewed. The patient's tolerance of                            previous anesthesia was also reviewed. The risks                            and benefits of the procedure and the sedation                            options and risks were discussed with the patient.                            All questions were answered, and informed consent                            was obtained. Prior Anticoagulants: The patient                            last took antiplatelet medication 5 days prior to                            the procedure. ASA Grade Assessment: III - A                            patient with severe systemic disease. After                            reviewing the risks and benefits, the patient was  deemed in satisfactory condition to undergo the                            procedure.                           After obtaining informed consent, the colonoscope                            was passed under direct vision. Throughout the                            procedure, the patient's blood pressure, pulse, and                            oxygen saturations were monitored continuously. The                            Olympus  [id number]-[id number] (#[id number]) Colonoscope was                            introduced through the anus and advanced to the the                            cecum, identified by appendiceal orifice and                            ileocecal valve. The colonoscopy was performed                            without difficulty. The patient tolerated the                            procedure well. The quality of the bowel                            preparation was adequate. The ileocecal valve,                            appendiceal orifice, and rectum were photographed. Scope In: 2:35:43 PM Scope Out: 3:02:42 PM Scope Withdrawal Time: 0 hours 19 minutes 25 seconds  Total Procedure Duration: 0 hours 26 minutes 59 seconds  Findings:                 The digital rectal exam findings include palpable                            rectal mass and hemorrhoids.                           A less than 1 mm polyp was found in the cecum. The                            polyp was sessile. The polyp was removed with a  cold biopsy forceps. Resection and retrieval were                            complete.                           Three sessile polyps were found in the sigmoid                            colon and transverse colon. The polyps were 5 to 10                            mm in size. These polyps were removed with a cold                            snare. Resection and retrieval were complete.                           A 50 mm polypoid lesion was found in the distal                            rectum extending to anal verge and hemorrhoidal                            plane. The lesion was frond-like/villous,                            infiltrative, ulcerated with central depression and                            multi-lobulated. No bleeding was present. Biopsies                            were taken with a cold forceps for histology.                            Proximal fold area was tattooed with  an injection                            of 3 mL of Spot (carbon black). Complications:            No immediate complications. Estimated Blood Loss:     Estimated blood loss was minimal. Impression:               - Palpable rectal mass and hemorrhoids found on                            digital rectal exam.                           - One less than 1 mm polyp in the cecum, removed                            with a cold biopsy forceps. Resected and  retrieved.                           - Three 5 to 10 mm polyps in the sigmoid colon and                            in the transverse colon, removed with a cold snare.                            Resected and retrieved.                           - Rule out malignancy, polypoid lesion in the                            distal rectum. Biopsied. Tattooed. Recommendation:           - Patient has a contact number available for                            emergencies. The signs and symptoms of potential                            delayed complications were discussed with the                            patient. Return to normal activities tomorrow.                            Written discharge instructions were provided to the                            patient.                           - Resume previous diet.                           - Continue present medications.                           - Await pathology results.                           - Refer to surgery for trans anal resection of                            distal rectal polypoid lesion                           - Repeat colonoscopy date to be determined after                            pending pathology results are reviewed for  surveillance based on pathology results.                           - Resume antiplatelet medication at prior dose in 3                            days. Refer to managing physician for further                            adjustment of therapy.                            - No ibuprofen, naproxen, or other non-steroidal                            anti-inflammatory drugs for 2 weeks. Mauri Pole, MD 09/09/2020 3:24:33 PM This report has been signed electronically.

## 2020-09-09 NOTE — Patient Instructions (Signed)
YOU HAD AN ENDOSCOPIC PROCEDURE TODAY AT THE [facility]:   Refer to the procedure report that was given to you for any specific questions about what was found during the examination.  If the procedure report does not answer your questions, please call your gastroenterologist to clarify.  If you requested that your care partner not be given the details of your procedure findings, then the procedure report has been included in a sealed envelope for you to review at your convenience later.  YOU SHOULD EXPECT: Some feelings of bloating in the abdomen. Passage of more gas than usual.  Walking can help get rid of the air that was put into your GI tract during the procedure and reduce the bloating. If you had a lower endoscopy (such as a colonoscopy or flexible sigmoidoscopy) you may notice spotting of blood in your stool or on the toilet paper. If you underwent a bowel prep for your procedure, you may not have a normal bowel movement for a few days.  Please Note:  You might notice some irritation and congestion in your nose or some drainage.  This is from the oxygen used during your procedure.  There is no need for concern and it should clear up in a day or so.  SYMPTOMS TO REPORT IMMEDIATELY:   Following lower endoscopy (colonoscopy or flexible sigmoidoscopy):  Excessive amounts of blood in the stool  Significant tenderness or worsening of abdominal pains  Swelling of the abdomen that is new, acute  Fever of 100F or higher   Following upper endoscopy (EGD)  Vomiting of blood or coffee ground material  New chest pain or pain under the shoulder blades  Painful or persistently difficult swallowing  New shortness of breath  Fever of 100F or higher  Black, tarry-looking stools  For urgent or emergent issues, a gastroenterologist can be reached at any hour by calling [phone]. Do not use MyChart messaging for urgent concerns.    DIET:  We do recommend a small meal at first, but  then you may proceed to your regular diet.  Drink plenty of fluids but you should avoid alcoholic beverages for 24 hours.  ACTIVITY:  You should plan to take it easy for the rest of today and you should NOT DRIVE or use heavy machinery until tomorrow (because of the sedation medicines used during the test).    FOLLOW UP: Our staff will call the number listed on your records 48-72 hours following your procedure to check on you and address any questions or concerns that you may have regarding the information given to you following your procedure. If we do not reach you, we will leave a message.  We will attempt to reach you two times.  During this call, we will ask if you have developed any symptoms of COVID 19. If you develop any symptoms (ie: fever, flu-like symptoms, shortness of breath, cough etc.) before then, please call [phone].  If you test positive for Covid 19 in the 2 weeks post procedure, please call and report this information to us.    If any biopsies were taken you will be contacted by phone or by letter within the next 1-3 weeks.  Please call us at [phone] if you have not heard about the biopsies in 3 weeks.    SIGNATURES/CONFIDENTIALITY: You and/or your care partner have signed paperwork which will be entered into your electronic medical record.  These signatures attest to the fact that that the information above on   your After Visit Summary has been reviewed and is understood.  Full responsibility of the confidentiality of this discharge information lies with you and/or your care-partner. 

## 2020-09-09 NOTE — Progress Notes (Signed)
pt tolerated well. VSS. awake and to recovery. Report given to RN. Bite block left insitu to recovery. No trauma noted.

## 2020-09-09 NOTE — Telephone Encounter (Signed)
Done prior authorization for Pantoprazole today through Cover My Meds   Waiting on response

## 2020-09-09 NOTE — Progress Notes (Signed)
Called to room to assist during endoscopic procedure.  Patient ID and intended procedure confirmed with present staff. Received instructions for my participation in the procedure from the performing physician.  

## 2020-09-09 NOTE — Op Note (Signed)
[facility] Patient Name: Joseph Vaughn Procedure Date: 09/09/2020 2:18 PM MRN: [id number] Endoscopist: Mauri Pole , MD Age: 65 Referring MD:  Date of Birth: Oct 24, 1955 Gender: Male Account #: [id number] Procedure:                Upper GI endoscopy Indications:              Esophageal reflux symptoms that persist despite                            appropriate therapy Medicines:                Monitored Anesthesia Care Procedure:                Pre-Anesthesia Assessment:                           - Prior to the procedure, a History and Physical                            was performed, and patient medications and                            allergies were reviewed. The patient's tolerance of                            previous anesthesia was also reviewed. The risks                            and benefits of the procedure and the sedation                            options and risks were discussed with the patient.                            All questions were answered, and informed consent                            was obtained. Prior Anticoagulants: The patient                            last took antiplatelet medication 5 days prior to                            the procedure. ASA Grade Assessment: III - A                            patient with severe systemic disease. After                            reviewing the risks and benefits, the patient was                            deemed in satisfactory condition to undergo the  procedure.                           After obtaining informed consent, the endoscope was                            passed under direct vision. Throughout the                            procedure, the patient's blood pressure, pulse, and                            oxygen saturations were monitored continuously. The                            Endoscope was introduced through the mouth, and                             advanced to the second part of duodenum. The upper                            GI endoscopy was accomplished without difficulty.                            The patient tolerated the procedure well. Scope In: Scope Out: Findings:                 LA Grade C (one or more mucosal breaks continuous                            between tops of 2 or more mucosal folds, less than                            75% circumference) esophagitis with no bleeding was                            found 34 to 36 cm from the incisors.                           Few cratered, linear and superficial esophageal                            ulcers with no bleeding and no stigmata of recent                            bleeding were found 34 to 36 cm from the incisors.                            The largest lesion was 5 mm in largest dimension.                           Patchy moderate inflammation with hemorrhage  characterized by adherent blood, congestion                            (edema), erosions and erythema was found in the                            gastric antrum. Biopsies were taken with a cold                            forceps for Helicobacter pylori testing.                           Patchy moderate inflammation characterized by                            congestion (edema), erosions and erythema was found                            in the duodenal bulb, in the first portion of the                            duodenum and in the second portion of the duodenum.                            Biopsies were taken with a cold forceps for                            histology. Complications:            No immediate complications. Estimated Blood Loss:     Estimated blood loss was minimal. Impression:               - LA Grade C reflux esophagitis with no bleeding.                           - Esophageal ulcers with no bleeding and no                            stigmata of recent bleeding.                            - Erosive gastritis with hemorrhage. Biopsied.                           - Erosive nodular duodenitis. Biopsied. Recommendation:           - Resume previous diet.                           - Continue present medications.                           - Await pathology results.                           - Use Protonix (pantoprazole) 40 mg PO daily X 30  days with 11 refills.                           - Use sucralfate tablets 1 gram PO QID for 1 month. Mauri Pole, MD 09/09/2020 3:15:39 PM This report has been signed electronically.

## 2020-09-13 ENCOUNTER — Telehealth: Payer: Self-pay | Admitting: *Deleted

## 2020-09-13 NOTE — Telephone Encounter (Signed)
  Follow up Call-  Call back number 09/09/2020  Post procedure Call Back phone  # [phone]  Permission to leave phone message Yes  Some recent data might be hidden     Patient questions:  Do you have a fever, pain , or abdominal swelling? No. Pain Score  0 *  Have you tolerated food without any problems? Yes.    Have you been able to return to your normal activities? Yes.    Do you have any questions about your discharge instructions: Diet   No. Medications  No. Follow up visit  No.  Do you have questions or concerns about your Care? No.  Actions: * If pain score is 4 or above: No action needed, pain <4

## 2020-09-22 ENCOUNTER — Other Ambulatory Visit: Payer: Self-pay

## 2020-09-22 ENCOUNTER — Encounter: Payer: Self-pay | Admitting: Emergency Medicine

## 2020-09-22 ENCOUNTER — Ambulatory Visit
Admission: EM | Admit: 2020-09-22 | Discharge: 2020-09-22 | Disposition: A | Discharge status: Home / Self Care | Payer: BC Managed Care – PPO | Attending: Internal Medicine | Admitting: Internal Medicine

## 2020-09-22 ENCOUNTER — Ambulatory Visit (INDEPENDENT_AMBULATORY_CARE_PROVIDER_SITE_OTHER): Payer: BC Managed Care – PPO

## 2020-09-22 DIAGNOSIS — M79661 Pain in right lower leg: Secondary | ICD-10-CM | POA: Diagnosis not present

## 2020-09-22 DIAGNOSIS — S8011XA Contusion of right lower leg, initial encounter: Secondary | ICD-10-CM | POA: Diagnosis not present

## 2020-09-22 DIAGNOSIS — Z041 Encounter for examination and observation following transport accident: Secondary | ICD-10-CM | POA: Diagnosis not present

## 2020-09-22 DIAGNOSIS — S8991XA Unspecified injury of right lower leg, initial encounter: Secondary | ICD-10-CM

## 2020-09-22 MED ORDER — CEFTRIAXONE SODIUM 1 G IJ SOLR
1.0000 g | Freq: Once | INTRAMUSCULAR | Status: AC
  Administered 2020-09-22: 1 g via INTRAMUSCULAR

## 2020-09-22 MED ORDER — DOXYCYCLINE HYCLATE 100 MG PO CAPS: 100.0000 mg | ORAL_CAPSULE | Freq: Two times a day (BID) | ORAL | 0 refills | Status: DC
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
## 2020-09-22 NOTE — ED Notes (Signed)
Patient is being discharged from the Urgent Care and sent to the Emergency Department via pov . Per Marga Hoots- Southworth , patient is in need of higher level of care due to r/o compartment syndrome . Patient is aware and verbalizes understanding of plan of care.  Vitals:   09/22/20 1618  BP: 135/84  Pulse: (!) 104  Resp: 18  Temp: 98 F (36.7 C)  SpO2: 97%

## 2020-09-22 NOTE — Discharge Instructions (Addendum)
Go to the ER to make sure you are not getting  compartment syndrome
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
## 2020-09-23 ENCOUNTER — Other Ambulatory Visit: Payer: Self-pay

## 2020-09-23 ENCOUNTER — Emergency Department (HOSPITAL_COMMUNITY): Payer: BC Managed Care – PPO

## 2020-09-23 ENCOUNTER — Emergency Department (HOSPITAL_COMMUNITY)
Admission: EM | Admit: 2020-09-23 | Discharge: 2020-09-24 | Disposition: A | Discharge status: Home / Self Care | Payer: BC Managed Care – PPO | Attending: Emergency Medicine | Admitting: Emergency Medicine

## 2020-09-23 ENCOUNTER — Encounter (HOSPITAL_COMMUNITY): Payer: Self-pay

## 2020-09-23 DIAGNOSIS — F17210 Nicotine dependence, cigarettes, uncomplicated: Secondary | ICD-10-CM | POA: Diagnosis not present

## 2020-09-23 DIAGNOSIS — S8011XA Contusion of right lower leg, initial encounter: Secondary | ICD-10-CM | POA: Diagnosis not present

## 2020-09-23 DIAGNOSIS — S7011XA Contusion of right thigh, initial encounter: Secondary | ICD-10-CM | POA: Diagnosis not present

## 2020-09-23 DIAGNOSIS — I2510 Atherosclerotic heart disease of native coronary artery without angina pectoris: Secondary | ICD-10-CM | POA: Insufficient documentation

## 2020-09-23 DIAGNOSIS — N182 Chronic kidney disease, stage 2 (mild): Secondary | ICD-10-CM | POA: Diagnosis not present

## 2020-09-23 DIAGNOSIS — M25461 Effusion, right knee: Secondary | ICD-10-CM | POA: Diagnosis not present

## 2020-09-23 DIAGNOSIS — Z7982 Long term (current) use of aspirin: Secondary | ICD-10-CM | POA: Insufficient documentation

## 2020-09-23 DIAGNOSIS — M79604 Pain in right leg: Secondary | ICD-10-CM

## 2020-09-23 DIAGNOSIS — R109 Unspecified abdominal pain: Secondary | ICD-10-CM | POA: Diagnosis not present

## 2020-09-23 DIAGNOSIS — L03115 Cellulitis of right lower limb: Secondary | ICD-10-CM | POA: Insufficient documentation

## 2020-09-23 DIAGNOSIS — S301XXA Contusion of abdominal wall, initial encounter: Secondary | ICD-10-CM | POA: Insufficient documentation

## 2020-09-23 DIAGNOSIS — S8991XA Unspecified injury of right lower leg, initial encounter: Secondary | ICD-10-CM | POA: Diagnosis not present

## 2020-09-23 MED ORDER — OXYCODONE-ACETAMINOPHEN 5-325 MG PO TABS
1.0000 | ORAL_TABLET | Freq: Once | ORAL | Status: DC
Start: 2020-09-23 — End: 2020-09-24
  Filled 2020-09-23: qty 1
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
## 2020-09-23 NOTE — ED Notes (Signed)
PIV initiated, blood work obtained and in lab. Requested urine sample and provided urinal. Bed locked and low, call bell within reach. Pt pain 0/10 at this time, will continue to monitor.
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
## 2020-09-24 ENCOUNTER — Telehealth (HOSPITAL_COMMUNITY): Payer: Self-pay | Admitting: Emergency Medicine

## 2020-09-24 DIAGNOSIS — R109 Unspecified abdominal pain: Secondary | ICD-10-CM | POA: Diagnosis not present

## 2020-09-24 DIAGNOSIS — M25461 Effusion, right knee: Secondary | ICD-10-CM | POA: Diagnosis not present

## 2020-09-24 DIAGNOSIS — M79604 Pain in right leg: Secondary | ICD-10-CM | POA: Diagnosis not present

## 2020-09-24 LAB — COMPREHENSIVE METABOLIC PANEL
ALT: 17 U/L (ref 0–44)
AST: 25 U/L (ref 15–41)
Albumin: 3.7 g/dL (ref 3.5–5.0)
Alkaline Phosphatase: 70 U/L (ref 38–126)
Anion gap: 9 (ref 5–15)
BUN: 13 mg/dL (ref 8–23)
CO2: 26 mmol/L (ref 22–32)
Calcium: 8.7 mg/dL — ABNORMAL LOW (ref 8.9–10.3)
Chloride: 103 mmol/L (ref 98–111)
Creatinine, Ser: 1.4 mg/dL — ABNORMAL HIGH (ref 0.61–1.24)
GFR, Estimated: 56 mL/min — ABNORMAL LOW (ref >60)
Glucose, Bld: 112 mg/dL — ABNORMAL HIGH (ref 70–99)
Potassium: 3.8 mmol/L (ref 3.5–5.1)
Sodium: 138 mmol/L (ref 135–145)
Total Bilirubin: 0.3 mg/dL (ref 0.3–1.2)
Total Protein: 7.3 g/dL (ref 6.5–8.1)

## 2020-09-24 LAB — CBC WITH DIFFERENTIAL/PLATELET
Abs Immature Granulocytes: 0.05 10*3/uL (ref 0.00–0.07)
Basophils Absolute: 0.1 10*3/uL (ref 0.0–0.1)
Basophils Relative: 1 %
Eosinophils Absolute: 0.2 10*3/uL (ref 0.0–0.5)
Eosinophils Relative: 3 %
HCT: 36.3 % — ABNORMAL LOW (ref 39.0–52.0)
Hemoglobin: 11.7 g/dL — ABNORMAL LOW (ref 13.0–17.0)
Immature Granulocytes: 1 %
Lymphocytes Relative: 26 %
Lymphs Abs: 2.3 10*3/uL (ref 0.7–4.0)
MCH: 30.6 pg (ref 26.0–34.0)
MCHC: 32.2 g/dL (ref 30.0–36.0)
MCV: 95 fL (ref 80.0–100.0)
Monocytes Absolute: 0.9 10*3/uL (ref 0.1–1.0)
Monocytes Relative: 10 %
Neutro Abs: 5.2 10*3/uL (ref 1.7–7.7)
Neutrophils Relative %: 59 %
Platelets: 244 10*3/uL (ref 150–400)
RBC: 3.82 MIL/uL — ABNORMAL LOW (ref 4.22–5.81)
RDW: 13 % (ref 11.5–15.5)
WBC: 8.8 10*3/uL (ref 4.0–10.5)
nRBC: 0 % (ref 0.0–0.2)

## 2020-09-24 LAB — URINALYSIS, ROUTINE W REFLEX MICROSCOPIC
Bilirubin Urine: NEGATIVE
Glucose, UA: NEGATIVE mg/dL
Hgb urine dipstick: NEGATIVE
Ketones, ur: NEGATIVE mg/dL
Leukocytes,Ua: NEGATIVE
Nitrite: NEGATIVE
Protein, ur: NEGATIVE mg/dL
Specific Gravity, Urine: 1.029 (ref 1.005–1.030)
pH: 6 (ref 5.0–8.0)

## 2020-09-24 MED ORDER — IOHEXOL 300 MG/ML  SOLN
100.0000 mL | Freq: Once | INTRAMUSCULAR | Status: AC | PRN
  Administered 2020-09-24: 100 mL via INTRAVENOUS

## 2020-09-24 NOTE — ED Notes (Signed)
Pt VS updated, urine sample obtained and sent to lab, EDP at bedside. Call bell within reach, bed locked and low. NAD noted. Will continue to monitor.

## 2020-09-24 NOTE — Telephone Encounter (Signed)
Order placed for outpatient doppler study

## 2020-09-24 NOTE — ED Notes (Signed)
Patient transported to CT 

## 2020-09-24 NOTE — Discharge Instructions (Addendum)
Take the antibiotics as prescribed.  Keep the leg elevated and apply ice.  As we discussed, compartment syndrome does not appear present at this time but you do have a risk of this.  You should return to the ED with worsening pain, pain with movement of your ankle, numbness, tingling, weakness, fever, temperature changes or any other concerns. Follow-up with Dr. Marlou Sa the orthopedic doctor or the emergency department for recheck in 24 to 48 hours.  Return sooner if your symptoms progress.

## 2020-10-04 ENCOUNTER — Telehealth: Payer: Self-pay

## 2020-10-04 ENCOUNTER — Encounter: Payer: Self-pay | Admitting: Gastroenterology

## 2020-10-04 NOTE — Telephone Encounter (Signed)
Appointment with Dr Dawit Boston at [facility] 10/12/20 for consideration of surgical removal of the large rectal polyp.

## 2020-10-12 ENCOUNTER — Ambulatory Visit: Payer: Self-pay | Admitting: Surgery

## 2020-10-12 ENCOUNTER — Telehealth: Payer: Self-pay | Admitting: *Deleted

## 2020-10-12 DIAGNOSIS — Z720 Tobacco use: Secondary | ICD-10-CM | POA: Diagnosis not present

## 2020-10-12 DIAGNOSIS — D128 Benign neoplasm of rectum: Secondary | ICD-10-CM | POA: Insufficient documentation

## 2020-10-12 DIAGNOSIS — Z7901 Long term (current) use of anticoagulants: Secondary | ICD-10-CM | POA: Diagnosis not present

## 2020-10-12 DIAGNOSIS — Z955 Presence of coronary angioplasty implant and graft: Secondary | ICD-10-CM | POA: Diagnosis not present

## 2020-10-12 NOTE — Telephone Encounter (Signed)
   [facility] Pre-operative Risk Assessment    Patient Name: Joseph Vaughn  DOB: 17-Apr-1956  MRN: [id number]   HEARTCARE STAFF: - Please ensure there is not already an duplicate clearance open for this procedure. - Under Visit Info/Reason for Call, type in Other and utilize the format Clearance MM/DD/YY or Clearance TBD. Do not use dashes or single digits. - If request is for dental extraction, please clarify the # of teeth to be extracted.  Request for surgical clearance:  1. What type of surgery is being performed? ROBOTIC TANIS PARTIAL PROCTECTOMY    2. When is this surgery scheduled? TBD   3. What type of clearance is required (medical clearance vs. Pharmacy clearance to hold med vs. Both)? MEDICAL  4. Are there any medications that need to be held prior to surgery and how long? ASA AND BRILINTA   5. Practice name and name of physician performing surgery? [facility]; DR. Remo Lipps [facility]  6. What is the office phone number? [phone]   7.   What is the office fax number? [place], CMA  8.   Anesthesia type (None, local, MAC, general) ? GENERAL   Julaine Hua 10/12/2020, 4:33 PM  _________________________________________________________________   (provider comments below)

## 2020-10-12 NOTE — H&P (Signed)
Joseph Vaughn Appointment: 10/12/2020 11:00 AM Location: [facility] Patient #: [id number] DOB: 1955/12/25 Married / Language: Joseph Vaughn / Race: White Male  History of Present Illness [name] MD; 10/12/2020 1:16 PM) The patient is a 65 year old male who presents with a colonic polyp. Note for "Colonic polyp": ` ` ` Patient sent for surgical consultation at the request of Dr Stev[facility]  Chief Complaint: Polyp in rectum. Request for transanal resection. ` ` The patient is a man who underwent a screening colonoscopy. Polyps removed. However bulkier polyp noted in lower rectum and. CEA done since not felt to be safely removal. Pathology came back consistent with adenomatous polyp. No high-grade dysplasia. Surgical consultation requested for transanal excision. Patient has chronic constipation, moving his bowels about twice a week. He notes it's been that way all the time. Does not take a fiber supplement. He does smoke. He does have a history coronary disease and required stenting in 2020. Followed by Dr. admission with [facility]. Chronically anticoagulated on Brilinta. He is not a diabetic. He doesn't know his family history very well but he recalls his sister telling him that colon cancer does run in the family. He thinks he's had hemorrhoid problems in the distant past but nothing major right now. No prior banding or interventions. No sleep apnea. Denies any history of any abdominal surgery. He claims he can walk a couple miles without difficulty.  No personal nor family history of inflammatory bowel disease, irritable bowel syndrome, allergy such as Celiac Sprue, dietary/dairy problems, colitis, ulcers nor gastritis. No recent sick contacts/gastroenteritis. No travel outside the country. No changes in diet. No dysphagia to solids or liquids. No significant heartburn or reflux. No melena, hematemesis, coffee ground emesis.  No evidence of prior gastric/peptic ulceration.  (Review of systems as stated in this history (HPI) or in the review of systems. Otherwise all other 12 point ROS are negative) ` ` ###########################################`  This patient encounter took 35 minutes today to perform the following: obtain history, perform exam, review outside records, interpret tests & imaging, counsel the patient on their diagnosis; and, document this encounter, including findings & plan in the electronic health record (EHR).   Past Surgical History [name], CMA; 10/12/2020 11:38 AM) Knee Surgery Left. Spinal Surgery - Lower Back Spinal Surgery - Neck Tonsillectomy  Allergies ([name], CMA; 10/12/2020 11:39 AM) No Known Drug Allergies [10/12/2020]:  Medication History [name], CMA; 10/12/2020 11:40 AM) Brilinta (90MG  Tablet, Oral) Active. buPROPion HCl ER (XL) (150MG  Tablet ER 24HR, Oral) Active. lamoTRIgine (150MG  Tablet, Oral) Active. Metoprolol Succinate ER (25MG  Tablet ER 24HR, Oral) Active. Pantoprazole Sodium (40MG  Tablet DR, Oral) Active. Rosuvastatin Calcium (40MG  Tablet, Oral) Active. Sucralfate (1GM Tablet, Oral) Active. Sutab (1479-225-188MG  Tablet, Oral) Active. Medications Reconciled  Social History [name], CMA; 10/12/2020 11:38 AM) Alcohol use Moderate alcohol use. Caffeine use Carbonated beverages. No drug use Tobacco use Current some day smoker.  Family History [name], [place]; 10/12/2020 11:38 AM) Family history unknown First Degree Relatives  Other Problems [name], CMA; 10/12/2020 11:38 AM) Alcohol Abuse Back Pain Gastroesophageal Reflux Disease Hypercholesterolemia Kidney Stone Melanoma     Review of Systems [name] CMA; 10/12/2020 11:38 AM) General Not Present- Appetite Loss, Chills, Fatigue, Fever, Night Sweats, Weight Gain and Weight Loss. Skin Present- Change in Wart/Mole. Not Present-  Dryness, Hives, Jaundice, New Lesions, Non-Healing Wounds, Rash and Ulcer. HEENT Present- Ringing in the Ears, Seasonal Allergies and Wears glasses/contact lenses.  Not Present- Earache, Hearing Loss, Hoarseness, Nose Bleed, Oral Ulcers, Sinus Pain, Sore Throat, Visual Disturbances and Yellow Eyes. Respiratory Not Present- Bloody sputum, Chronic Cough, Difficulty Breathing, Snoring and Wheezing. Breast Not Present- Breast Mass, Breast Pain, Nipple Discharge and Skin Changes. Gastrointestinal Present- Hemorrhoids. Not Present- Abdominal Pain, Bloating, Bloody Stool, Change in Bowel Habits, Chronic diarrhea, Constipation, Difficulty Swallowing, Excessive gas, Gets full quickly at meals, Indigestion, Nausea, Rectal Pain and Vomiting. Male Genitourinary Not Present- Blood in Urine, Change in Urinary Stream, Frequency, Impotence, Nocturia, Painful Urination, Urgency and Urine Leakage. Musculoskeletal Present- Back Pain, Joint Pain and Joint Stiffness. Not Present- Muscle Pain, Muscle Weakness and Swelling of Extremities. Neurological Present- Headaches. Not Present- Decreased Memory, Fainting, Numbness, Seizures, Tingling, Tremor, Trouble walking and Weakness. Psychiatric Present- Bipolar. Not Present- Anxiety, Change in Sleep Pattern, Depression, Fearful and Frequent crying. Hematology Present- Blood Thinners and Easy Bruising. Not Present- Excessive bleeding, Gland problems, HIV and Persistent Infections.  Vitals ([name] CMA; 10/12/2020 11:39 AM) 10/12/2020 11:39 AM Weight: 169 lb Height: 65in Body Surface Area: 1.84 m Body Mass Index: 28.12 kg/m  Pulse: 74 (Regular)  BP: 162/82(Sitting, Left Arm, Standard)        Physical Exam [name] MD; 10/12/2020 11:59 AM)  General Mental Status-Alert. General Appearance-Not in acute distress, Not Sickly. Orientation-Oriented X3. Hydration-Well hydrated. Voice-Normal.  Integumentary Global Assessment Upon inspection  and palpation of skin surfaces of the - Axillae: non-tender, no inflammation or ulceration, no drainage. and Distribution of scalp and body hair is normal. General Characteristics Temperature - normal warmth is noted.  Head and Neck Head-normocephalic, atraumatic with no lesions or palpable masses. Face Global Assessment - atraumatic, no absence of expression. Neck Global Assessment - no abnormal movements, no bruit auscultated on the right, no bruit auscultated on the left, no decreased range of motion, non-tender. Trachea-midline. Thyroid Gland Characteristics - non-tender.  Eye Eyeball - Left-Extraocular movements intact, No Nystagmus - Left. Eyeball - Right-Extraocular movements intact, No Nystagmus - Right. Cornea - Left-No Hazy - Left. Cornea - Right-No Hazy - Right. Sclera/Conjunctiva - Left-No scleral icterus, No Discharge - Left. Sclera/Conjunctiva - Right-No scleral icterus, No Discharge - Right. Pupil - Left-Direct reaction to light normal. Pupil - Right-Direct reaction to light normal.  ENMT Ears Pinna - Left - no drainage observed, no generalized tenderness observed. Pinna - Right - no drainage observed, no generalized tenderness observed. Nose and Sinuses External Inspection of the Nose - no destructive lesion observed. Inspection of the nares - Left - quiet respiration. Inspection of the nares - Right - quiet respiration. Mouth and Throat Lips - Upper Lip - no fissures observed, no pallor noted. Lower Lip - no fissures observed, no pallor noted. Nasopharynx - no discharge present. Oral Cavity/Oropharynx - Tongue - no dryness observed. Oral Mucosa - no cyanosis observed. Hypopharynx - no evidence of airway distress observed.  Chest and Lung Exam Inspection Movements - Normal and Symmetrical. Accessory muscles - No use of accessory muscles in breathing. Palpation Palpation of the chest reveals - Non-tender. Auscultation Breath sounds - Normal and  Clear.  Cardiovascular Auscultation Rhythm - Regular. Murmurs & Other Heart Sounds - Auscultation of the heart reveals - No Murmurs and No Systolic Clicks.  Abdomen Inspection Inspection of the abdomen reveals - No Visible peristalsis and No Abnormal pulsations. Umbilicus - No Bleeding, No Urine drainage. Palpation/Percussion Palpation and Percussion of the abdomen reveal - Soft, Non Tender, No Rebound tenderness, No Rigidity (guarding) and No Cutaneous hyperesthesia. Note: Abdomen  soft. Nontender. Not distended. No umbilical or incisional hernias. No guarding.  Male Genitourinary Sexual Maturity Tanner 5 - Adult hair pattern and Adult penile size and shape. Note: No inguinal hernias. Normal external genitalia. Epididymi, testes, and spermatic cords normal without any masses.  Rectal Note: Perianal skin clear without any fissure or fistula or abscess. Normal sphincter tone. No external hemorrhoids.  Tolerates digital exam. In posterior midrectum going somewhat to the left lateral sidewall there is a friable mass. Field 4-6 centimeters from the anal verge. Friable. Correlates with adenomatous polyp seen and biopsied by gastroenterology. Prostate smooth and only mildly enlarged.  Peripheral Vascular Upper Extremity Inspection - Left - No Cyanotic nailbeds - Left, Not Ischemic. Inspection - Right - No Cyanotic nailbeds - Right, Not Ischemic.  Neurologic Neurologic evaluation reveals -normal attention span and ability to concentrate, able to name objects and repeat phrases. Appropriate fund of knowledge , normal sensation and normal coordination. Mental Status Affect - not angry, not paranoid. Cranial Nerves-Normal Bilaterally. Gait-Normal.  Neuropsychiatric Mental status exam performed with findings of-able to articulate well with normal speech/language, rate, volume and coherence, thought content normal with ability to perform basic computations and apply abstract  reasoning and no evidence of hallucinations, delusions, obsessions or homicidal/suicidal ideation.  Musculoskeletal Global Assessment Spine, Ribs and Pelvis - no instability, subluxation or laxity. Right Upper Extremity - no instability, subluxation or laxity.  Lymphatic Head & Neck  General Head & Neck Lymphatics: Bilateral - Description - No Localized lymphadenopathy. Axillary  General Axillary Region: Bilateral - Description - No Localized lymphadenopathy. Femoral & Inguinal  Generalized Femoral & Inguinal Lymphatics: Left - Description - No Localized lymphadenopathy. Right - Description - No Localized lymphadenopathy.    Assessment & Plan [name] MD; 10/12/2020 12:11 PM)  ADENOMATOUS RECTAL POLYP (D12.8) Impression: Friable polypoid mass. Biopsy consistent with adenomatous polyp. No definite high-grade dysplasia. [place].  I think he would benefit from transanal excision. This involves the mid rectum so you been containing for a robotic Tanis versus TEM approach. Most likely overnight stay but sometimes can be outpatient if it is very distal. Given his cardiac history, most likely will watch overnight.  Current Plans You are being scheduled for surgery- Our schedulers will call you.  You should hear from our office's scheduling department within 5 working days about the location, date, and time of surgery. We try to make accommodations for patient's preferences in scheduling surgery, but sometimes the OR schedule or the surgeon's schedule prevents [place] from making those accommodations.  If you have not heard from our office [phone] in 5 working days, call the office and ask for your surgeon's nurse.  If you have other questions about your diagnosis, plan, or surgery, call the office and ask for your surgeon's nurse.  The anatomy & physiology of the digestive tract was discussed. The pathophysiology of the rectal pathology was discussed. Natural history risks  without surgery was discussed. I feel the risks of no intervention will lead to serious problems that outweigh the operative risks; therefore, I recommended surgery.  Laparoscopic & open abdominal techniques were discussed. I recommended we start with a partial proctectomy by transanal endoscopic microsurgery (TEM) for excisional biopsy to remove the pathology and hopefully cure and/or control the pathology. This technique can offer less operative risk and faster post-operative recovery. Possible need for immediate or later abdominal surgery for further treatment was discussed.  Risks such as bleeding, abscess, reoperation, ostomy, heart attack, death, and other risks were discussed.  I noted a good likelihood this will help address the problem. Goals of post-operative recovery were discussed as well. We will work to minimize complications. An educational handout was given as well. Questions were answered. The patient expresses understanding & wishes to proceed with surgery.  Pt Education - [facility] TEM Education (Shandrika Ambers): discussed with patient and provided information. Pt Education - Polyps in the Colon and Rectum (Colonic and Rectal Polyps): colonic polyps  PREOP COLON - ENCOUNTER FOR PREOPERATIVE EXAMINATION FOR GENERAL SURGICAL PROCEDURE [id number])  Current Plans Written instructions provided Pt Education - [facility] Colon Bowel Prep 2018 ERAS/Miralax/Antibiotics Started Neomycin Sulfate 500 MG Oral Tablet, 2 (two) Tablet SEE NOTE, #6, 10/12/2020, No Refill. Local Order: Pharmacist Notes: TAKE TWO TABLETS AT 2 PM, 3 PM, AND 10 PM THE DAY PRIOR TO SURGERY Started metroNIDAZOLE 500 MG Oral Tablet, 2 (two) Tablet three times daily, #6, 1 day starting 10/12/2020, No Refill. Local Order: Pharmacist Notes: Pharmacy Instructions: Take 2 tablets at 2pm, 3pm, and 10pm the day prior to your colon operation.  HISTORY OF CORONARY ARTERY STENT PLACEMENT (Z95.5)   ANTICOAGULATED (Z79.01) Impression:  An a candidate on Brilinta for his stents. Most likely need to hold 5 days preprocedure surgery. Double check with his cardiologist at that is okay  Current Plans I recommended obtaining preoperative cardiac clearance. I am concerned about the health of the patient and the ability to tolerate the operation. Therefore, we will request clearance by cardiology to better assess operative risk & see if a reevaluation, further workup, etc is needed. Also recommendations on how medications such as for anticoagulation and blood pressure should be managed/held/restarted after surgery. Pt Education - [facility] Hold anticoagulation preoperatively  TOBACCO ABUSE (Z72.0) Impression: STOP SMOKING!  We talked to the patient about the dangers of smoking. We stressed that tobacco use dramatically increases the risk of peri-operative complications such as infection, tissue necrosis leaving to problems with incision/wound and organ healing, hernia, chronic pain, heart attack, stroke, DVT, pulmonary embolism, and death. We noted there are programs in our community to help stop smoking. Information was available.  Current Plans Pt Education - [facility] STOP SMOKING!  CHRONIC CONSTIPATION (K59.09) Impression: I strongly recommend he get on a fiber supplement overate his constipation. He delayed then went this way all his life and didn't see the reason. I tried explaining the reasoning for allowing the risk of polyps cancer and cardiac issues. We will see if he agrees.  Current Plans Pt Education - [facility] Constipation (AT) Pt Education - [facility] Good Bowel Health (Ikenna Ohms)  [name], MD, FACS, MASCRS  Esophageal, Gastrointestinal & Colorectal Surgery Robotic and Minimally Invasive Surgery [facility] [address] [place], [place] [address] [phone] Fax [phone] Main/Paging  CONTACT INFORMATION: Weekday (9AM-5PM) concerns: Call [facility] main office at [phone] Weeknight  (5PM-9AM) or Weekend/Holiday concerns: Check [email or web address] for General Surgery [facility] coverage (Please, do not use SecureChat as it is not reliable communication to operating surgeons for immediate patient care)

## 2020-10-12 NOTE — H&P (View-Only) (Signed)
Joseph Vaughn Appointment: 10/12/2020 11:00 AM Location: [facility] Patient #: [id number] DOB: 1955/12/25 Married / Language: Joseph Vaughn / Race: White Male  History of Present Illness [name] MD; 10/12/2020 1:16 PM) The patient is a 65 year old male who presents with a colonic polyp. Note for "Colonic polyp": ` ` ` Patient sent for surgical consultation at the request of Dr Stev[facility]  Chief Complaint: Polyp in rectum. Request for transanal resection. ` ` The patient is a man who underwent a screening colonoscopy. Polyps removed. However bulkier polyp noted in lower rectum and. CEA done since not felt to be safely removal. Pathology came back consistent with adenomatous polyp. No high-grade dysplasia. Surgical consultation requested for transanal excision. Patient has chronic constipation, moving his bowels about twice a week. He notes it's been that way all the time. Does not take a fiber supplement. He does smoke. He does have a history coronary disease and required stenting in 2020. Followed by Dr. admission with [facility]. Chronically anticoagulated on Brilinta. He is not a diabetic. He doesn't know his family history very well but he recalls his sister telling him that colon cancer does run in the family. He thinks he's had hemorrhoid problems in the distant past but nothing major right now. No prior banding or interventions. No sleep apnea. Denies any history of any abdominal surgery. He claims he can walk a couple miles without difficulty.  No personal nor family history of inflammatory bowel disease, irritable bowel syndrome, allergy such as Celiac Sprue, dietary/dairy problems, colitis, ulcers nor gastritis. No recent sick contacts/gastroenteritis. No travel outside the country. No changes in diet. No dysphagia to solids or liquids. No significant heartburn or reflux. No melena, hematemesis, coffee ground emesis.  No evidence of prior gastric/peptic ulceration.  (Review of systems as stated in this history (HPI) or in the review of systems. Otherwise all other 12 point ROS are negative) ` ` ###########################################`  This patient encounter took 35 minutes today to perform the following: obtain history, perform exam, review outside records, interpret tests & imaging, counsel the patient on their diagnosis; and, document this encounter, including findings & plan in the electronic health record (EHR).   Past Surgical History [name], CMA; 10/12/2020 11:38 AM) Knee Surgery Left. Spinal Surgery - Lower Back Spinal Surgery - Neck Tonsillectomy  Allergies ([name], CMA; 10/12/2020 11:39 AM) No Known Drug Allergies [10/12/2020]:  Medication History [name], CMA; 10/12/2020 11:40 AM) Brilinta (90MG  Tablet, Oral) Active. buPROPion HCl ER (XL) (150MG  Tablet ER 24HR, Oral) Active. lamoTRIgine (150MG  Tablet, Oral) Active. Metoprolol Succinate ER (25MG  Tablet ER 24HR, Oral) Active. Pantoprazole Sodium (40MG  Tablet DR, Oral) Active. Rosuvastatin Calcium (40MG  Tablet, Oral) Active. Sucralfate (1GM Tablet, Oral) Active. Sutab (1479-225-188MG  Tablet, Oral) Active. Medications Reconciled  Social History [name], CMA; 10/12/2020 11:38 AM) Alcohol use Moderate alcohol use. Caffeine use Carbonated beverages. No drug use Tobacco use Current some day smoker.  Family History [name], [place]; 10/12/2020 11:38 AM) Family history unknown First Degree Relatives  Other Problems [name], CMA; 10/12/2020 11:38 AM) Alcohol Abuse Back Pain Gastroesophageal Reflux Disease Hypercholesterolemia Kidney Stone Melanoma     Review of Systems [name] CMA; 10/12/2020 11:38 AM) General Not Present- Appetite Loss, Chills, Fatigue, Fever, Night Sweats, Weight Gain and Weight Loss. Skin Present- Change in Wart/Mole. Not Present-  Dryness, Hives, Jaundice, New Lesions, Non-Healing Wounds, Rash and Ulcer. HEENT Present- Ringing in the Ears, Seasonal Allergies and Wears glasses/contact lenses.  Not Present- Earache, Hearing Loss, Hoarseness, Nose Bleed, Oral Ulcers, Sinus Pain, Sore Throat, Visual Disturbances and Yellow Eyes. Respiratory Not Present- Bloody sputum, Chronic Cough, Difficulty Breathing, Snoring and Wheezing. Breast Not Present- Breast Mass, Breast Pain, Nipple Discharge and Skin Changes. Gastrointestinal Present- Hemorrhoids. Not Present- Abdominal Pain, Bloating, Bloody Stool, Change in Bowel Habits, Chronic diarrhea, Constipation, Difficulty Swallowing, Excessive gas, Gets full quickly at meals, Indigestion, Nausea, Rectal Pain and Vomiting. Male Genitourinary Not Present- Blood in Urine, Change in Urinary Stream, Frequency, Impotence, Nocturia, Painful Urination, Urgency and Urine Leakage. Musculoskeletal Present- Back Pain, Joint Pain and Joint Stiffness. Not Present- Muscle Pain, Muscle Weakness and Swelling of Extremities. Neurological Present- Headaches. Not Present- Decreased Memory, Fainting, Numbness, Seizures, Tingling, Tremor, Trouble walking and Weakness. Psychiatric Present- Bipolar. Not Present- Anxiety, Change in Sleep Pattern, Depression, Fearful and Frequent crying. Hematology Present- Blood Thinners and Easy Bruising. Not Present- Excessive bleeding, Gland problems, HIV and Persistent Infections.  Vitals ([name] CMA; 10/12/2020 11:39 AM) 10/12/2020 11:39 AM Weight: 169 lb Height: 65in Body Surface Area: 1.84 m Body Mass Index: 28.12 kg/m  Pulse: 74 (Regular)  BP: 162/82(Sitting, Left Arm, Standard)        Physical Exam [name] MD; 10/12/2020 11:59 AM)  General Mental Status-Alert. General Appearance-Not in acute distress, Not Sickly. Orientation-Oriented X3. Hydration-Well hydrated. Voice-Normal.  Integumentary Global Assessment Upon inspection  and palpation of skin surfaces of the - Axillae: non-tender, no inflammation or ulceration, no drainage. and Distribution of scalp and body hair is normal. General Characteristics Temperature - normal warmth is noted.  Head and Neck Head-normocephalic, atraumatic with no lesions or palpable masses. Face Global Assessment - atraumatic, no absence of expression. Neck Global Assessment - no abnormal movements, no bruit auscultated on the right, no bruit auscultated on the left, no decreased range of motion, non-tender. Trachea-midline. Thyroid Gland Characteristics - non-tender.  Eye Eyeball - Left-Extraocular movements intact, No Nystagmus - Left. Eyeball - Right-Extraocular movements intact, No Nystagmus - Right. Cornea - Left-No Hazy - Left. Cornea - Right-No Hazy - Right. Sclera/Conjunctiva - Left-No scleral icterus, No Discharge - Left. Sclera/Conjunctiva - Right-No scleral icterus, No Discharge - Right. Pupil - Left-Direct reaction to light normal. Pupil - Right-Direct reaction to light normal.  ENMT Ears Pinna - Left - no drainage observed, no generalized tenderness observed. Pinna - Right - no drainage observed, no generalized tenderness observed. Nose and Sinuses External Inspection of the Nose - no destructive lesion observed. Inspection of the nares - Left - quiet respiration. Inspection of the nares - Right - quiet respiration. Mouth and Throat Lips - Upper Lip - no fissures observed, no pallor noted. Lower Lip - no fissures observed, no pallor noted. Nasopharynx - no discharge present. Oral Cavity/Oropharynx - Tongue - no dryness observed. Oral Mucosa - no cyanosis observed. Hypopharynx - no evidence of airway distress observed.  Chest and Lung Exam Inspection Movements - Normal and Symmetrical. Accessory muscles - No use of accessory muscles in breathing. Palpation Palpation of the chest reveals - Non-tender. Auscultation Breath sounds - Normal and  Clear.  Cardiovascular Auscultation Rhythm - Regular. Murmurs & Other Heart Sounds - Auscultation of the heart reveals - No Murmurs and No Systolic Clicks.  Abdomen Inspection Inspection of the abdomen reveals - No Visible peristalsis and No Abnormal pulsations. Umbilicus - No Bleeding, No Urine drainage. Palpation/Percussion Palpation and Percussion of the abdomen reveal - Soft, Non Tender, No Rebound tenderness, No Rigidity (guarding) and No Cutaneous hyperesthesia. Note: Abdomen  soft. Nontender. Not distended. No umbilical or incisional hernias. No guarding.  Male Genitourinary Sexual Maturity Tanner 5 - Adult hair pattern and Adult penile size and shape. Note: No inguinal hernias. Normal external genitalia. Epididymi, testes, and spermatic cords normal without any masses.  Rectal Note: Perianal skin clear without any fissure or fistula or abscess. Normal sphincter tone. No external hemorrhoids.  Tolerates digital exam. In posterior midrectum going somewhat to the left lateral sidewall there is a friable mass. Field 4-6 centimeters from the anal verge. Friable. Correlates with adenomatous polyp seen and biopsied by gastroenterology. Prostate smooth and only mildly enlarged.  Peripheral Vascular Upper Extremity Inspection - Left - No Cyanotic nailbeds - Left, Not Ischemic. Inspection - Right - No Cyanotic nailbeds - Right, Not Ischemic.  Neurologic Neurologic evaluation reveals -normal attention span and ability to concentrate, able to name objects and repeat phrases. Appropriate fund of knowledge , normal sensation and normal coordination. Mental Status Affect - not angry, not paranoid. Cranial Nerves-Normal Bilaterally. Gait-Normal.  Neuropsychiatric Mental status exam performed with findings of-able to articulate well with normal speech/language, rate, volume and coherence, thought content normal with ability to perform basic computations and apply abstract  reasoning and no evidence of hallucinations, delusions, obsessions or homicidal/suicidal ideation.  Musculoskeletal Global Assessment Spine, Ribs and Pelvis - no instability, subluxation or laxity. Right Upper Extremity - no instability, subluxation or laxity.  Lymphatic Head & Neck  General Head & Neck Lymphatics: Bilateral - Description - No Localized lymphadenopathy. Axillary  General Axillary Region: Bilateral - Description - No Localized lymphadenopathy. Femoral & Inguinal  Generalized Femoral & Inguinal Lymphatics: Left - Description - No Localized lymphadenopathy. Right - Description - No Localized lymphadenopathy.    Assessment & Plan [name] MD; 10/12/2020 12:11 PM)  ADENOMATOUS RECTAL POLYP (D12.8) Impression: Friable polypoid mass. Biopsy consistent with adenomatous polyp. No definite high-grade dysplasia. [place].  I think he would benefit from transanal excision. This involves the mid rectum so you been containing for a robotic Tanis versus TEM approach. Most likely overnight stay but sometimes can be outpatient if it is very distal. Given his cardiac history, most likely will watch overnight.  Current Plans You are being scheduled for surgery- Our schedulers will call you.  You should hear from our office's scheduling department within 5 working days about the location, date, and time of surgery. We try to make accommodations for patient's preferences in scheduling surgery, but sometimes the OR schedule or the surgeon's schedule prevents [place] from making those accommodations.  If you have not heard from our office [phone]) in 5 working days, call the office and ask for your surgeon's nurse.  If you have other questions about your diagnosis, plan, or surgery, call the office and ask for your surgeon's nurse.  The anatomy & physiology of the digestive tract was discussed. The pathophysiology of the rectal pathology was discussed. Natural history risks  without surgery was discussed. I feel the risks of no intervention will lead to serious problems that outweigh the operative risks; therefore, I recommended surgery.  Laparoscopic & open abdominal techniques were discussed. I recommended we start with a partial proctectomy by transanal endoscopic microsurgery (TEM) for excisional biopsy to remove the pathology and hopefully cure and/or control the pathology. This technique can offer less operative risk and faster post-operative recovery. Possible need for immediate or later abdominal surgery for further treatment was discussed.  Risks such as bleeding, abscess, reoperation, ostomy, heart attack, death, and other risks were discussed.  I noted a good likelihood this will help address the problem. Goals of post-operative recovery were discussed as well. We will work to minimize complications. An educational handout was given as well. Questions were answered. The patient expresses understanding & wishes to proceed with surgery.  Pt Education - [facility] TEM Education (Kashira Behunin): discussed with patient and provided information. Pt Education - Polyps in the Colon and Rectum (Colonic and Rectal Polyps): colonic polyps  PREOP COLON - ENCOUNTER FOR PREOPERATIVE EXAMINATION FOR GENERAL SURGICAL PROCEDURE [id number])  Current Plans Written instructions provided Pt Education - [facility] Colon Bowel Prep 2018 ERAS/Miralax/Antibiotics Started Neomycin Sulfate 500 MG Oral Tablet, 2 (two) Tablet SEE NOTE, #6, 10/12/2020, No Refill. Local Order: Pharmacist Notes: TAKE TWO TABLETS AT 2 PM, 3 PM, AND 10 PM THE DAY PRIOR TO SURGERY Started metroNIDAZOLE 500 MG Oral Tablet, 2 (two) Tablet three times daily, #6, 1 day starting 10/12/2020, No Refill. Local Order: Pharmacist Notes: Pharmacy Instructions: Take 2 tablets at 2pm, 3pm, and 10pm the day prior to your colon operation.  HISTORY OF CORONARY ARTERY STENT PLACEMENT (Z95.5)   ANTICOAGULATED (Z79.01) Impression:  An a candidate on Brilinta for his stents. Most likely need to hold 5 days preprocedure surgery. Double check with his cardiologist at that is okay  Current Plans I recommended obtaining preoperative cardiac clearance. I am concerned about the health of the patient and the ability to tolerate the operation. Therefore, we will request clearance by cardiology to better assess operative risk & see if a reevaluation, further workup, etc is needed. Also recommendations on how medications such as for anticoagulation and blood pressure should be managed/held/restarted after surgery. Pt Education - [facility] Hold anticoagulation preoperatively  TOBACCO ABUSE (Z72.0) Impression: STOP SMOKING!  We talked to the patient about the dangers of smoking. We stressed that tobacco use dramatically increases the risk of peri-operative complications such as infection, tissue necrosis leaving to problems with incision/wound and organ healing, hernia, chronic pain, heart attack, stroke, DVT, pulmonary embolism, and death. We noted there are programs in our community to help stop smoking. Information was available.  Current Plans Pt Education - [facility] STOP SMOKING!  CHRONIC CONSTIPATION (K59.09) Impression: I strongly recommend he get on a fiber supplement overate his constipation. He delayed then went this way all his life and didn't see the reason. I tried explaining the reasoning for allowing the risk of polyps cancer and cardiac issues. We will see if he agrees.  Current Plans Pt Education - [facility] Constipation (AT) Pt Education - [facility] Good Bowel Health (Collette Pescador)  [name], MD, FACS, MASCRS  Esophageal, Gastrointestinal & Colorectal Surgery Robotic and Minimally Invasive Surgery [facility] [address] [place], [place] [address] [phone] Fax [phone] Main/Paging  CONTACT INFORMATION: Weekday (9AM-5PM) concerns: Call [facility] main office at [phone] Weeknight  (5PM-9AM) or Weekend/Holiday concerns: Check [email or web address] for General Surgery [facility] coverage (Please, do not use SecureChat as it is not reliable communication to operating surgeons for immediate patient care)

## 2020-10-13 NOTE — Telephone Encounter (Signed)
   Name: Joseph Vaughn  DOB: Mar 26, 1956  MRN: [id number]   Primary Cardiologist: Jenkins Rouge, MD  Chart reviewed as part of pre-operative protocol coverage.   Left voicemail for patient to call back for ongoing preop assessment.  Per Dr. Johnsie Cancel, he has been in touch with Dr. Johney Maine and patient can hold brilinta 5 days prior to his upcoming procedure.   Abigail Butts, PA-C 10/13/2020, 2:54 PM

## 2020-10-13 NOTE — Telephone Encounter (Signed)
   Name: Joseph Vaughn  DOB: 12-05-1955  MRN: [id number]   Primary Cardiologist: Jenkins Rouge, MD  Chart reviewed as part of pre-operative protocol coverage. Patient was contacted 10/13/2020 in reference to pre-operative risk assessment for pending surgery as outlined below.  Joseph Vaughn was last seen on 07/07/20 by Dr. Johnsie Cancel.  Since that day, Joseph Vaughn has done well from a cardiac standpoint. He can complete 4 METs without anginal complaints.  Therefore, based on ACC/AHA guidelines, the patient would be at acceptable risk for the planned procedure without further cardiovascular testing.   The patient was advised that if he develops new symptoms prior to surgery to contact our office to arrange for a follow-up visit, and he verbalized understanding.  Per Dr. Johnsie Cancel, he has been in touch with Dr. Johney Maine and patient can hold brilinta 5 days prior to his upcoming procedure. Brilinta should be restarted when cleared to do so by his surgeon.    I will route this recommendation to the requesting party via Epic fax function and remove from pre-op pool. Please call with questions.  Abigail Butts, PA-C 10/13/2020, 3:45 PM

## 2020-10-13 NOTE — Telephone Encounter (Signed)
Hi Dr. Johnsie Cancel,  Patient has an upcoming robotic tanis partial proctectomy planned (date TBD) and is being asked to hold Aspirin and Brilinta. He has a history of CAD s/p multiple PCI most recently DES to LAD in 2019. Repeat cath in 08/2018 showed patent stents. You last saw the patient in 06/2020 at which time he was doing well. Can you please comment on how long patient can hold Aspirin and Brilinta?  Please route response back to P CV DIV PREOP.   Thank you! Joseph Vaughn

## 2020-10-31 NOTE — Patient Instructions (Signed)
DUE TO COVID-19 ONLY ONE VISITOR IS ALLOWED TO COME WITH YOU AND STAY IN THE WAITING ROOM ONLY DURING PRE OP AND PROCEDURE DAY OF SURGERY. THE 1 VISITOR  MAY VISIT WITH YOU AFTER SURGERY IN YOUR PRIVATE ROOM DURING VISITING HOURS ONLY!  YOU NEED TO HAVE A COVID 19 TEST ON: 11/08/20 @ 11:00 AM, THIS TEST MUST BE DONE BEFORE SURGERY,  COVID TESTING SITE [place] [id number], IT IS ON THE RIGHT GOING OUT [address] APPROXIMATELY  2 MINUTES PAST [employer] ON THE RIGHT. ONCE YOUR COVID TEST IS COMPLETED,  PLEASE BEGIN THE QUARANTINE INSTRUCTIONS AS OUTLINED IN YOUR HANDOUT.                Joseph Vaughn    Your procedure is scheduled on: 11/09/20   Report to [facility] Main  Entrance   Report to admitting at: 1:00 PM     Call this number if you have problems the morning of surgery [phone]    Remember: DRINK 2 PRESURGERY ENSURE DRINKS THE NIGHT BEFORE SURGERY AT  1000 PM AND 1 PRESURGERY DRINK THE DAY OF THE PROCEDURE 3 HOURS PRIOR TO SCHEDULED SURGERY( 12:00 PM). NO SOLIDS AFTER MIDNIGHT THE DAY PRIOR TO THE SURGERY. NOTHING BY MOUTH EXCEPT CLEAR LIQUIDS UNTIL THREE HOURS PRIOR TO SCHEDULED SURGERY(12:00 PM). PLEASE FINISH PRESURGERY ENSURE DRINK PER SURGEON ORDER 3 HOURS PRIOR TO SCHEDULED SURGERY TIME WHICH NEEDS TO BE COMPLETED AT: 12:00 PM.  CLEAR LIQUID DIET  Foods Allowed                                                                     Foods Excluded  Coffee and tea, regular and decaf                             liquids that you cannot  Plain Jell-O any favor except red or purple                                           see through such as: Fruit ices (not with fruit pulp)                                     milk, soups, orange juice  Iced Popsicles                                    All solid food Carbonated beverages, regular and diet                                    Cranberry, grape and apple juices Sports drinks like  Gatorade Lightly seasoned clear broth or consume(fat free) Sugar, honey syrup  Sample Menu Breakfast  Lunch                                     Supper Cranberry juice                    Beef broth                            Chicken broth Jell-O                                     Grape juice                           Apple juice Coffee or tea                        Jell-O                                      Popsicle                                                Coffee or tea                        Coffee or tea  _____________________________________________________________________  BRUSH YOUR TEETH MORNING OF SURGERY AND RINSE YOUR MOUTH OUT, NO CHEWING GUM CANDY OR MINTS.    Take these medicines the morning of surgery with A SIP OF WATER: lamotrigine,bupropion.                               You may not have any metal on your body including hair pins and              piercings  Do not wear jewelry, make-up, lotions, powders or perfumes, deodorant             Do not wear nail polish on your fingernails.  Do not shave  48 hours prior to surgery.    Do not bring valuables to the hospital. [place].  Contacts, dentures or bridgework may not be worn into surgery.  Leave suitcase in the car. After surgery it may be brought to your room.     Patients discharged the day of surgery will not be allowed to drive home. IF YOU ARE HAVING SURGERY AND GOING HOME THE SAME DAY, YOU MUST HAVE AN ADULT TO DRIVE YOU HOME AND BE WITH YOU FOR 24 HOURS. YOU MAY GO HOME BY TAXI OR UBER OR ORTHERWISE, BUT AN ADULT MUST ACCOMPANY YOU HOME AND STAY WITH YOU FOR 24 HOURS.  Name and phone number of your driver:  Special Instructions: N/A              Please read over the following fact sheets you were given: _____________________________________________________________________  [place] - Preparing for Surgery Before  surgery, you can play an important role.  Because skin is not sterile, your skin needs to be as free of germs as possible.  You can reduce the number of germs on your skin by washing with CHG (chlorahexidine gluconate) soap before surgery.  CHG is an antiseptic cleaner which kills germs and bonds with the skin to continue killing germs even after washing. Please DO NOT use if you have an allergy to CHG or antibacterial soaps.  If your skin becomes reddened/irritated stop using the CHG and inform your nurse when you arrive at Short Stay. Do not shave (including legs and underarms) for at least 48 hours prior to the first CHG shower.  You may shave your face/neck. Please follow these instructions carefully:  1.  Shower with CHG Soap the night before surgery and the  morning of Surgery.  2.  If you choose to wash your hair, wash your hair first as usual with your  normal  shampoo.  3.  After you shampoo, rinse your hair and body thoroughly to remove the  shampoo.                           4.  Use CHG as you would any other liquid soap.  You can apply chg directly  to the skin and wash                       Gently with a scrungie or clean washcloth.  5.  Apply the CHG Soap to your body ONLY FROM THE NECK DOWN.   Do not use on face/ open                           Wound or open sores. Avoid contact with eyes, ears mouth and genitals (private parts).                       Wash face,  Genitals (private parts) with your normal soap.             6.  Wash thoroughly, paying special attention to the area where your surgery  will be performed.  7.  Thoroughly rinse your body with warm water from the neck down.  8.  DO NOT shower/wash with your normal soap after using and rinsing off  the CHG Soap.                9.  Pat yourself dry with a clean towel.            10.  Wear clean pajamas.            11.  Place clean sheets on your bed the night of your first shower and do not  sleep with pets. Day of Surgery  : Do not apply any lotions/deodorants the morning of surgery.  Please wear clean clothes to the hospital/surgery center.  FAILURE TO FOLLOW THESE INSTRUCTIONS MAY RESULT IN THE CANCELLATION OF YOUR SURGERY PATIENT SIGNATURE_________________________________  NURSE SIGNATURE__________________________________  ________________________________________________________________________   Joseph Vaughn  An incentive spirometer is a tool that can help keep your lungs clear and active. This tool measures how well you are filling your lungs with each breath. Taking long deep breaths may help reverse or decrease the chance of developing breathing (pulmonary) problems (especially infection) following:  A long period of  time when you are unable to move or be active. BEFORE THE PROCEDURE   If the spirometer includes an indicator to show your best effort, your nurse or respiratory therapist will set it to a desired goal.  If possible, sit up straight or lean slightly forward. Try not to slouch.  Hold the incentive spirometer in an upright position. INSTRUCTIONS FOR USE  1. Sit on the edge of your bed if possible, or sit up as far as you can in bed or on a chair. 2. Hold the incentive spirometer in an upright position. 3. Breathe out normally. 4. Place the mouthpiece in your mouth and seal your lips tightly around it. 5. Breathe in slowly and as deeply as possible, raising the piston or the ball toward the top of the column. 6. Hold your breath for 3-5 seconds or for as long as possible. Allow the piston or ball to fall to the bottom of the column. 7. Remove the mouthpiece from your mouth and breathe out normally. 8. Rest for a few seconds and repeat Steps 1 through 7 at least 10 times every 1-2 hours when you are awake. Take your time and take a few normal breaths between deep breaths. 9. The spirometer may include an indicator to show your best effort. Use the indicator as a goal to work  toward during each repetition. 10. After each set of 10 deep breaths, practice coughing to be sure your lungs are clear. If you have an incision (the cut made at the time of surgery), support your incision when coughing by placing a pillow or rolled up towels firmly against it. Once you are able to get out of bed, walk around indoors and cough well. You may stop using the incentive spirometer when instructed by your caregiver.  RISKS AND COMPLICATIONS  Take your time so you do not get dizzy or light-headed.  If you are in pain, you may need to take or ask for pain medication before doing incentive spirometry. It is harder to take a deep breath if you are having pain. AFTER USE  Rest and breathe slowly and easily.  It can be helpful to keep track of a log of your progress. Your caregiver can provide you with a simple table to help with this. If you are using the spirometer at home, follow these instructions: [place] IF:   You are having difficultly using the spirometer.  You have trouble using the spirometer as often as instructed.  Your pain medication is not giving enough relief while using the spirometer.  You develop fever of 100.5 F (38.1 C) or higher. SEEK IMMEDIATE MEDICAL CARE IF:   You cough up bloody sputum that had not been present before.  You develop fever of 102 F (38.9 C) or greater.  You develop worsening pain at or near the incision site. MAKE SURE YOU:   Understand these instructions.  Will watch your condition.  Will get help right away if you are not doing well or get worse. Document Released: 10/08/2006 Document Revised: 08/20/2011 Document Reviewed: 12/09/2006 [facility] Patient Information 2014 [place], [place].   ________________________________________________________________________

## 2020-11-01 ENCOUNTER — Other Ambulatory Visit: Payer: Self-pay

## 2020-11-01 ENCOUNTER — Encounter (HOSPITAL_COMMUNITY): Payer: Self-pay

## 2020-11-01 ENCOUNTER — Encounter (HOSPITAL_COMMUNITY)
Admission: RE | Admit: 2020-11-01 | Discharge: 2020-11-01 | Disposition: A | Discharge status: Home / Self Care | Payer: BC Managed Care – PPO | Source: Ambulatory Visit | Attending: Surgery | Admitting: Surgery

## 2020-11-01 DIAGNOSIS — Z792 Long term (current) use of antibiotics: Secondary | ICD-10-CM | POA: Diagnosis not present

## 2020-11-01 DIAGNOSIS — F17210 Nicotine dependence, cigarettes, uncomplicated: Secondary | ICD-10-CM | POA: Diagnosis not present

## 2020-11-01 DIAGNOSIS — I447 Left bundle-branch block, unspecified: Secondary | ICD-10-CM | POA: Insufficient documentation

## 2020-11-01 DIAGNOSIS — Z7982 Long term (current) use of aspirin: Secondary | ICD-10-CM | POA: Insufficient documentation

## 2020-11-01 DIAGNOSIS — N182 Chronic kidney disease, stage 2 (mild): Secondary | ICD-10-CM | POA: Insufficient documentation

## 2020-11-01 DIAGNOSIS — I2510 Atherosclerotic heart disease of native coronary artery without angina pectoris: Secondary | ICD-10-CM | POA: Insufficient documentation

## 2020-11-01 DIAGNOSIS — Z01818 Encounter for other preprocedural examination: Secondary | ICD-10-CM | POA: Insufficient documentation

## 2020-11-01 DIAGNOSIS — Z79899 Other long term (current) drug therapy: Secondary | ICD-10-CM | POA: Diagnosis not present

## 2020-11-01 DIAGNOSIS — K621 Rectal polyp: Secondary | ICD-10-CM | POA: Diagnosis not present

## 2020-11-01 DIAGNOSIS — Z955 Presence of coronary angioplasty implant and graft: Secondary | ICD-10-CM | POA: Insufficient documentation

## 2020-11-01 HISTORY — DX: Malignant (primary) neoplasm, unspecified: C80.1

## 2020-11-01 HISTORY — DX: Nonspecific intraventricular block: I45.4

## 2020-11-01 LAB — BASIC METABOLIC PANEL
Anion gap: 6 (ref 5–15)
BUN: 12 mg/dL (ref 8–23)
CO2: 27 mmol/L (ref 22–32)
Calcium: 9 mg/dL (ref 8.9–10.3)
Chloride: 107 mmol/L (ref 98–111)
Creatinine, Ser: 1.58 mg/dL — ABNORMAL HIGH (ref 0.61–1.24)
GFR, Estimated: 49 mL/min — ABNORMAL LOW (ref >60)
Glucose, Bld: 108 mg/dL — ABNORMAL HIGH (ref 70–99)
Potassium: 4.3 mmol/L (ref 3.5–5.1)
Sodium: 140 mmol/L (ref 135–145)

## 2020-11-01 LAB — CBC
HCT: 39 % (ref 39.0–52.0)
Hemoglobin: 12.7 g/dL — ABNORMAL LOW (ref 13.0–17.0)
MCH: 30.7 pg (ref 26.0–34.0)
MCHC: 32.6 g/dL (ref 30.0–36.0)
MCV: 94.2 fL (ref 80.0–100.0)
Platelets: 219 10*3/uL (ref 150–400)
RBC: 4.14 MIL/uL — ABNORMAL LOW (ref 4.22–5.81)
RDW: 12.7 % (ref 11.5–15.5)
WBC: 9.5 10*3/uL (ref 4.0–10.5)
nRBC: 0 % (ref 0.0–0.2)

## 2020-11-01 NOTE — Progress Notes (Addendum)
COVID Vaccine Completed: NO Date COVID Vaccine completed: COVID vaccine manufacturer: [employer]   PCP - Dr. Elsie Stain Cardiologist - Dr. Jenkins Rouge. Clearance: Lorelee Cover Kroeger: 10/13/20: EPIC  Chest x-ray -  EKG -  Stress Test -  ECHO - 07/21/19 Cardiac Cath - 08/13/18 Pacemaker/ICD device last checked:  Sleep Study -  CPAP -   Fasting Blood Sugar -  Checks Blood Sugar _____ times a day  Blood Thinner Instructions:Brilinta: will be hold 5 days before as per cardiologist. Aspirin Instructions: Last Dose:  Anesthesia review: Hx: HTN,chest pain,CAD,CKD,MI,smoker. Lt. BBB(as per pt.,onece)  Patient denies shortness of breath, fever, cough and chest pain at PAT appointment   Patient verbalized understanding of instructions that were given to them at the PAT appointment. Patient was also instructed that they will need to review over the PAT instructions again at home before surgery.

## 2020-11-02 LAB — HEMOGLOBIN A1C
Hgb A1c MFr Bld: 5.7 % — ABNORMAL HIGH (ref 4.8–5.6)
Mean Plasma Glucose: 117 mg/dL
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
## 2020-11-08 ENCOUNTER — Other Ambulatory Visit (HOSPITAL_COMMUNITY)
Admission: RE | Admit: 2020-11-08 | Discharge: 2020-11-08 | Disposition: A | Discharge status: Home / Self Care | Payer: BC Managed Care – PPO | Source: Ambulatory Visit | Attending: Surgery | Admitting: Surgery

## 2020-11-08 DIAGNOSIS — Z20822 Contact with and (suspected) exposure to covid-19: Secondary | ICD-10-CM | POA: Insufficient documentation

## 2020-11-08 DIAGNOSIS — Z01812 Encounter for preprocedural laboratory examination: Secondary | ICD-10-CM | POA: Insufficient documentation

## 2020-11-08 LAB — SARS CORONAVIRUS 2 (TAT 6-24 HRS): SARS Coronavirus 2: NEGATIVE

## 2020-11-08 MED ORDER — BUPIVACAINE LIPOSOME 1.3 % IJ SUSP
20.0000 mL | Freq: Once | INTRAMUSCULAR | Status: DC
  Filled 2020-11-08: qty 20

## 2020-11-09 ENCOUNTER — Observation Stay (HOSPITAL_COMMUNITY)
Admission: RE | Admit: 2020-11-09 | Discharge: 2020-11-10 | Disposition: A | Discharge status: Home / Self Care | Payer: BC Managed Care – PPO | Attending: Surgery | Admitting: Surgery

## 2020-11-09 ENCOUNTER — Other Ambulatory Visit: Payer: Self-pay

## 2020-11-09 ENCOUNTER — Encounter (HOSPITAL_COMMUNITY): Payer: Self-pay | Admitting: Surgery

## 2020-11-09 ENCOUNTER — Inpatient Hospital Stay (HOSPITAL_COMMUNITY): Payer: BC Managed Care – PPO | Admitting: Physician Assistant

## 2020-11-09 ENCOUNTER — Encounter (HOSPITAL_COMMUNITY)
Admission: RE | Disposition: A | Discharge status: Home / Self Care | Payer: Self-pay | Source: Home / Self Care | Attending: Surgery

## 2020-11-09 ENCOUNTER — Inpatient Hospital Stay (HOSPITAL_COMMUNITY): Payer: BC Managed Care – PPO | Admitting: Anesthesiology

## 2020-11-09 DIAGNOSIS — N182 Chronic kidney disease, stage 2 (mild): Secondary | ICD-10-CM | POA: Diagnosis not present

## 2020-11-09 DIAGNOSIS — I2510 Atherosclerotic heart disease of native coronary artery without angina pectoris: Secondary | ICD-10-CM | POA: Diagnosis not present

## 2020-11-09 DIAGNOSIS — I129 Hypertensive chronic kidney disease with stage 1 through stage 4 chronic kidney disease, or unspecified chronic kidney disease: Secondary | ICD-10-CM | POA: Diagnosis not present

## 2020-11-09 DIAGNOSIS — K648 Other hemorrhoids: Secondary | ICD-10-CM | POA: Diagnosis not present

## 2020-11-09 DIAGNOSIS — K621 Rectal polyp: Secondary | ICD-10-CM | POA: Diagnosis not present

## 2020-11-09 DIAGNOSIS — D128 Benign neoplasm of rectum: Principal | ICD-10-CM | POA: Insufficient documentation

## 2020-11-09 DIAGNOSIS — K6289 Other specified diseases of anus and rectum: Secondary | ICD-10-CM | POA: Diagnosis present

## 2020-11-09 DIAGNOSIS — F319 Bipolar disorder, unspecified: Secondary | ICD-10-CM | POA: Diagnosis present

## 2020-11-09 DIAGNOSIS — Z7901 Long term (current) use of anticoagulants: Secondary | ICD-10-CM | POA: Insufficient documentation

## 2020-11-09 DIAGNOSIS — F17200 Nicotine dependence, unspecified, uncomplicated: Secondary | ICD-10-CM | POA: Diagnosis not present

## 2020-11-09 HISTORY — PX: XI ROBOT ASSISTED TRANSANAL RESECTION: SHX6848

## 2020-11-09 SURGERY — XI ROBOT ASSISTED TRANSANAL RESECTION
Anesthesia: General | Site: Rectum

## 2020-11-09 MED ORDER — PHENYLEPHRINE 40 MCG/ML (10ML) SYRINGE FOR IV PUSH (FOR BLOOD PRESSURE SUPPORT)
PREFILLED_SYRINGE | INTRAVENOUS | Status: DC | PRN
  Administered 2020-11-09: 80 ug via INTRAVENOUS
  Administered 2020-11-09 (×2): 120 ug via INTRAVENOUS
  Administered 2020-11-09: 80 ug via INTRAVENOUS

## 2020-11-09 MED ORDER — PROCHLORPERAZINE EDISYLATE 10 MG/2ML IJ SOLN: 5.0000 mg | Freq: Four times a day (QID) | INTRAMUSCULAR | Status: DC | PRN

## 2020-11-09 MED ORDER — ALUM & MAG HYDROXIDE-SIMETH 200-200-20 MG/5ML PO SUSP: 30.0000 mL | Freq: Four times a day (QID) | ORAL | Status: DC | PRN

## 2020-11-09 MED ORDER — ONDANSETRON HCL 4 MG PO TABS: 4.0000 mg | ORAL_TABLET | Freq: Four times a day (QID) | ORAL | Status: DC | PRN

## 2020-11-09 MED ORDER — PROCHLORPERAZINE MALEATE 10 MG PO TABS
10.0000 mg | ORAL_TABLET | Freq: Four times a day (QID) | ORAL | Status: DC | PRN
  Filled 2020-11-09: qty 1

## 2020-11-09 MED ORDER — ENOXAPARIN SODIUM 40 MG/0.4ML IJ SOSY
40.0000 mg | PREFILLED_SYRINGE | INTRAMUSCULAR | Status: DC
  Filled 2020-11-09: qty 0.4

## 2020-11-09 MED ORDER — SODIUM CHLORIDE 0.9% FLUSH: 3.0000 mL | Freq: Two times a day (BID) | INTRAVENOUS | Status: DC

## 2020-11-09 MED ORDER — ALVIMOPAN 12 MG PO CAPS
12.0000 mg | ORAL_CAPSULE | ORAL | Status: AC
  Administered 2020-11-09: 12 mg via ORAL
  Filled 2020-11-09: qty 1

## 2020-11-09 MED ORDER — METOPROLOL SUCCINATE ER 25 MG PO TB24
25.0000 mg | ORAL_TABLET | Freq: Once | ORAL | Status: AC
  Administered 2020-11-09: 25 mg via ORAL
  Filled 2020-11-09: qty 1

## 2020-11-09 MED ORDER — LIP MEDEX EX OINT
1.0000 "application " | TOPICAL_OINTMENT | Freq: Two times a day (BID) | CUTANEOUS | Status: DC
  Administered 2020-11-10: 1 via TOPICAL

## 2020-11-09 MED ORDER — LACTATED RINGERS IV SOLN: INTRAVENOUS | Status: AC

## 2020-11-09 MED ORDER — CELECOXIB 200 MG PO CAPS
200.0000 mg | ORAL_CAPSULE | ORAL | Status: AC
  Administered 2020-11-09: 200 mg via ORAL
  Filled 2020-11-09: qty 1

## 2020-11-09 MED ORDER — LAMOTRIGINE 25 MG PO TABS
150.0000 mg | ORAL_TABLET | Freq: Every morning | ORAL | Status: DC
  Administered 2020-11-10: 150 mg via ORAL
  Filled 2020-11-09: qty 1

## 2020-11-09 MED ORDER — LACTATED RINGERS IV SOLN: INTRAVENOUS | Status: DC

## 2020-11-09 MED ORDER — ONDANSETRON HCL 4 MG/2ML IJ SOLN
INTRAMUSCULAR | Status: DC | PRN
  Administered 2020-11-09: 4 mg via INTRAVENOUS

## 2020-11-09 MED ORDER — CALCIUM POLYCARBOPHIL 625 MG PO TABS
625.0000 mg | ORAL_TABLET | Freq: Two times a day (BID) | ORAL | Status: DC
  Administered 2020-11-09 – 2020-11-10 (×2): 625 mg via ORAL
  Filled 2020-11-09 (×2): qty 1

## 2020-11-09 MED ORDER — SODIUM CHLORIDE 0.9 % IV SOLN: 250.0000 mL | INTRAVENOUS | Status: DC | PRN

## 2020-11-09 MED ORDER — SODIUM CHLORIDE 0.9% FLUSH: 3.0000 mL | INTRAVENOUS | Status: DC | PRN

## 2020-11-09 MED ORDER — CHLORHEXIDINE GLUCONATE CLOTH 2 % EX PADS: 6.0000 | MEDICATED_PAD | Freq: Once | CUTANEOUS | Status: DC

## 2020-11-09 MED ORDER — ASPIRIN EC 81 MG PO TBEC
81.0000 mg | DELAYED_RELEASE_TABLET | Freq: Every morning | ORAL | Status: DC
  Administered 2020-11-10: 81 mg via ORAL
  Filled 2020-11-09: qty 1

## 2020-11-09 MED ORDER — PROPOFOL 10 MG/ML IV BOLUS
INTRAVENOUS | Status: DC | PRN
  Administered 2020-11-09: 160 mg via INTRAVENOUS

## 2020-11-09 MED ORDER — MELATONIN 3 MG PO TABS: 3.0000 mg | ORAL_TABLET | Freq: Every evening | ORAL | Status: DC | PRN

## 2020-11-09 MED ORDER — BUPIVACAINE LIPOSOME 1.3 % IJ SUSP
INTRAMUSCULAR | Status: DC | PRN
  Administered 2020-11-09: 20 mL

## 2020-11-09 MED ORDER — DIAZEPAM 5 MG PO TABS: 5.0000 mg | ORAL_TABLET | Freq: Four times a day (QID) | ORAL | Status: DC | PRN

## 2020-11-09 MED ORDER — ORAL CARE MOUTH RINSE: 15.0000 mL | Freq: Once | OROMUCOSAL | Status: AC

## 2020-11-09 MED ORDER — CHLORHEXIDINE GLUCONATE 0.12 % MT SOLN
15.0000 mL | Freq: Once | OROMUCOSAL | Status: AC
  Administered 2020-11-09: 15 mL via OROMUCOSAL

## 2020-11-09 MED ORDER — ALBUMIN HUMAN 5 % IV SOLN: INTRAVENOUS | Status: DC | PRN

## 2020-11-09 MED ORDER — GABAPENTIN 300 MG PO CAPS
300.0000 mg | ORAL_CAPSULE | ORAL | Status: AC
  Administered 2020-11-09: 300 mg via ORAL
  Filled 2020-11-09: qty 1

## 2020-11-09 MED ORDER — ACETAMINOPHEN 500 MG PO TABS
1000.0000 mg | ORAL_TABLET | Freq: Four times a day (QID) | ORAL | Status: DC
  Administered 2020-11-10: 1000 mg via ORAL
  Filled 2020-11-09 (×2): qty 2

## 2020-11-09 MED ORDER — ALBUMIN HUMAN 5 % IV SOLN
INTRAVENOUS | Status: AC
  Filled 2020-11-09: qty 250

## 2020-11-09 MED ORDER — FENTANYL CITRATE (PF) 250 MCG/5ML IJ SOLN
INTRAMUSCULAR | Status: AC
  Filled 2020-11-09: qty 5

## 2020-11-09 MED ORDER — NITROGLYCERIN 0.4 MG SL SUBL: 0.4000 mg | SUBLINGUAL_TABLET | SUBLINGUAL | Status: DC | PRN

## 2020-11-09 MED ORDER — BUPROPION HCL ER (XL) 150 MG PO TB24
150.0000 mg | ORAL_TABLET | Freq: Every morning | ORAL | Status: DC
  Administered 2020-11-10: 150 mg via ORAL
  Filled 2020-11-09: qty 1

## 2020-11-09 MED ORDER — ACETAMINOPHEN 500 MG PO TABS
1000.0000 mg | ORAL_TABLET | ORAL | Status: AC
  Administered 2020-11-09: 1000 mg via ORAL
  Filled 2020-11-09: qty 2

## 2020-11-09 MED ORDER — LIDOCAINE 2% (20 MG/ML) 5 ML SYRINGE
INTRAMUSCULAR | Status: DC | PRN
  Administered 2020-11-09: 60 mg via INTRAVENOUS

## 2020-11-09 MED ORDER — FENTANYL CITRATE (PF) 100 MCG/2ML IJ SOLN
INTRAMUSCULAR | Status: DC | PRN
  Administered 2020-11-09: 50 ug via INTRAVENOUS
  Administered 2020-11-09: 100 ug via INTRAVENOUS

## 2020-11-09 MED ORDER — ENSURE PRE-SURGERY PO LIQD
296.0000 mL | Freq: Once | ORAL | Status: DC
  Filled 2020-11-09: qty 296

## 2020-11-09 MED ORDER — ENSURE SURGERY PO LIQD
237.0000 mL | Freq: Two times a day (BID) | ORAL | Status: DC
  Administered 2020-11-10: 237 mL via ORAL

## 2020-11-09 MED ORDER — PANTOPRAZOLE SODIUM 40 MG PO TBEC: 40.0000 mg | DELAYED_RELEASE_TABLET | Freq: Every evening | ORAL | Status: DC

## 2020-11-09 MED ORDER — SODIUM CHLORIDE 0.9 % IV SOLN
2.0000 g | INTRAVENOUS | Status: AC
  Administered 2020-11-09: 2 g via INTRAVENOUS
  Filled 2020-11-09: qty 2

## 2020-11-09 MED ORDER — MAGIC MOUTHWASH
15.0000 mL | Freq: Four times a day (QID) | ORAL | Status: DC | PRN
  Filled 2020-11-09: qty 15

## 2020-11-09 MED ORDER — DIBUCAINE (PERIANAL) 1 % EX OINT
TOPICAL_OINTMENT | CUTANEOUS | Status: AC
  Filled 2020-11-09: qty 28

## 2020-11-09 MED ORDER — ENOXAPARIN SODIUM 40 MG/0.4ML IJ SOSY
40.0000 mg | PREFILLED_SYRINGE | Freq: Once | INTRAMUSCULAR | Status: AC
  Administered 2020-11-09: 40 mg via SUBCUTANEOUS
  Filled 2020-11-09: qty 0.4

## 2020-11-09 MED ORDER — LACTATED RINGERS IV BOLUS: 1000.0000 mL | Freq: Three times a day (TID) | INTRAVENOUS | Status: DC | PRN

## 2020-11-09 MED ORDER — SUGAMMADEX SODIUM 200 MG/2ML IV SOLN
INTRAVENOUS | Status: DC | PRN
  Administered 2020-11-09: 200 mg via INTRAVENOUS

## 2020-11-09 MED ORDER — ONDANSETRON HCL 4 MG/2ML IJ SOLN
4.0000 mg | Freq: Four times a day (QID) | INTRAMUSCULAR | Status: DC | PRN
  Administered 2020-11-09: 4 mg via INTRAVENOUS
  Filled 2020-11-09: qty 2

## 2020-11-09 MED ORDER — SIMETHICONE 80 MG PO CHEW: 40.0000 mg | CHEWABLE_TABLET | Freq: Four times a day (QID) | ORAL | Status: DC | PRN

## 2020-11-09 MED ORDER — ALVIMOPAN 12 MG PO CAPS: 12.0000 mg | ORAL_CAPSULE | Freq: Two times a day (BID) | ORAL | Status: DC

## 2020-11-09 MED ORDER — POLYETHYLENE GLYCOL 3350 17 GM/SCOOP PO POWD
1.0000 | Freq: Once | ORAL | Status: DC
  Filled 2020-11-09: qty 255

## 2020-11-09 MED ORDER — DEXAMETHASONE SODIUM PHOSPHATE 10 MG/ML IJ SOLN
INTRAMUSCULAR | Status: DC | PRN
  Administered 2020-11-09: 5 mg via INTRAVENOUS

## 2020-11-09 MED ORDER — HYDROMORPHONE HCL 1 MG/ML IJ SOLN: 0.5000 mg | INTRAMUSCULAR | Status: DC | PRN

## 2020-11-09 MED ORDER — ALBUMIN HUMAN 5 % IV SOLN
INTRAVENOUS | Status: AC
  Filled 2020-11-09: qty 500

## 2020-11-09 MED ORDER — 0.9 % SODIUM CHLORIDE (POUR BTL) OPTIME
TOPICAL | Status: DC | PRN
  Administered 2020-11-09: 1000 mL

## 2020-11-09 MED ORDER — METRONIDAZOLE 500 MG PO TABS
1000.0000 mg | ORAL_TABLET | ORAL | Status: DC
  Filled 2020-11-09: qty 2

## 2020-11-09 MED ORDER — KETAMINE HCL 10 MG/ML IJ SOLN
INTRAMUSCULAR | Status: DC | PRN
  Administered 2020-11-09: 30 mg via INTRAVENOUS

## 2020-11-09 MED ORDER — DIAZEPAM 5 MG PO TABS: 5.0000 mg | ORAL_TABLET | Freq: Four times a day (QID) | ORAL | 2 refills | Status: DC | PRN

## 2020-11-09 MED ORDER — MIDAZOLAM HCL 5 MG/5ML IJ SOLN
INTRAMUSCULAR | Status: DC | PRN
  Administered 2020-11-09: 2 mg via INTRAVENOUS

## 2020-11-09 MED ORDER — MIDAZOLAM HCL 2 MG/2ML IJ SOLN
INTRAMUSCULAR | Status: AC
  Filled 2020-11-09: qty 2

## 2020-11-09 MED ORDER — OXYCODONE HCL 5 MG PO TABS: 5.0000 mg | ORAL_TABLET | ORAL | Status: DC | PRN

## 2020-11-09 MED ORDER — PROPOFOL 10 MG/ML IV BOLUS
INTRAVENOUS | Status: AC
  Filled 2020-11-09: qty 20

## 2020-11-09 MED ORDER — LIDOCAINE 2% (20 MG/ML) 5 ML SYRINGE
INTRAMUSCULAR | Status: DC | PRN
  Administered 2020-11-09: 1.5 mg/kg/h via INTRAVENOUS

## 2020-11-09 MED ORDER — ROSUVASTATIN CALCIUM 20 MG PO TABS: 40.0000 mg | ORAL_TABLET | Freq: Every evening | ORAL | Status: DC

## 2020-11-09 MED ORDER — METOPROLOL TARTRATE 5 MG/5ML IV SOLN: 5.0000 mg | Freq: Four times a day (QID) | INTRAVENOUS | Status: DC | PRN

## 2020-11-09 MED ORDER — NEOMYCIN SULFATE 500 MG PO TABS
1000.0000 mg | ORAL_TABLET | ORAL | Status: DC
  Filled 2020-11-09: qty 2

## 2020-11-09 MED ORDER — BUPIVACAINE-EPINEPHRINE 0.25% -1:200000 IJ SOLN
INTRAMUSCULAR | Status: DC | PRN
  Administered 2020-11-09: 30 mL

## 2020-11-09 MED ORDER — DIPHENHYDRAMINE HCL 50 MG/ML IJ SOLN: 12.5000 mg | Freq: Four times a day (QID) | INTRAMUSCULAR | Status: DC | PRN

## 2020-11-09 MED ORDER — ROCURONIUM BROMIDE 10 MG/ML (PF) SYRINGE
PREFILLED_SYRINGE | INTRAVENOUS | Status: DC | PRN
  Administered 2020-11-09: 100 mg via INTRAVENOUS

## 2020-11-09 MED ORDER — ONDANSETRON HCL 4 MG/2ML IJ SOLN
INTRAMUSCULAR | Status: AC
  Filled 2020-11-09: qty 2

## 2020-11-09 MED ORDER — ENALAPRILAT 1.25 MG/ML IV SOLN
0.6250 mg | Freq: Four times a day (QID) | INTRAVENOUS | Status: DC | PRN
  Filled 2020-11-09: qty 1

## 2020-11-09 MED ORDER — OXYCODONE HCL 5 MG PO TABS: 5.0000 mg | ORAL_TABLET | Freq: Four times a day (QID) | ORAL | 0 refills | Status: DC | PRN

## 2020-11-09 MED ORDER — LACTATED RINGERS IR SOLN
Status: DC | PRN
  Administered 2020-11-09: 1000 mL

## 2020-11-09 MED ORDER — ENSURE PRE-SURGERY PO LIQD
592.0000 mL | Freq: Once | ORAL | Status: DC
  Filled 2020-11-09: qty 592

## 2020-11-09 MED ORDER — DIPHENHYDRAMINE HCL 12.5 MG/5ML PO ELIX: 12.5000 mg | ORAL_SOLUTION | Freq: Four times a day (QID) | ORAL | Status: DC | PRN

## 2020-11-09 MED ORDER — BISACODYL 5 MG PO TBEC: 20.0000 mg | DELAYED_RELEASE_TABLET | Freq: Once | ORAL | Status: DC

## 2020-11-09 MED ORDER — FENTANYL CITRATE (PF) 100 MCG/2ML IJ SOLN: 25.0000 ug | INTRAMUSCULAR | Status: DC | PRN

## 2020-11-09 MED ORDER — SODIUM CHLORIDE 0.9 % IV SOLN
2.0000 g | Freq: Two times a day (BID) | INTRAVENOUS | Status: AC
  Administered 2020-11-10: 2 g via INTRAVENOUS
  Filled 2020-11-09: qty 2

## 2020-11-09 SURGICAL SUPPLY — 48 items
BRIEF STRETCH FOR OB PAD LRG (UNDERPADS AND DIAPERS) ×2 IMPLANT
COVER SURGICAL LIGHT HANDLE (MISCELLANEOUS) ×2 IMPLANT
COVER TIP SHEARS 8 DVNC (MISCELLANEOUS) ×1 IMPLANT
COVER TIP SHEARS 8MM DA VINCI (MISCELLANEOUS) ×2
DRAPE ARM DVNC X/XI (DISPOSABLE) ×4 IMPLANT
DRAPE C-ARM 42X120 X-RAY (DRAPES) IMPLANT
DRAPE COLUMN DVNC XI (DISPOSABLE) ×1 IMPLANT
DRAPE DA VINCI XI ARM (DISPOSABLE) ×8
DRAPE DA VINCI XI COLUMN (DISPOSABLE) ×2
DRAPE ORTHO SPLIT 77X108 STRL (DRAPES)
DRAPE SURG IRRIG POUCH 19X23 (DRAPES) ×2 IMPLANT
DRAPE SURG ORHT 6 SPLT 77X108 (DRAPES) IMPLANT
DRSG PAD ABDOMINAL 8X10 ST (GAUZE/BANDAGES/DRESSINGS) IMPLANT
ELECT REM PT RETURN 15FT ADLT (MISCELLANEOUS) ×2 IMPLANT
GAUZE SPONGE 4X4 12PLY STRL (GAUZE/BANDAGES/DRESSINGS) ×2 IMPLANT
GLOVE SURG LTX SZ8 (GLOVE) ×2 IMPLANT
GLOVE SURG UNDER LTX SZ8 (GLOVE) ×2 IMPLANT
GOWN STRL REUS W/TWL XL LVL3 (GOWN DISPOSABLE) ×4 IMPLANT
IRRIG SUCT STRYKERFLOW 2 WTIP (MISCELLANEOUS) ×2
IRRIGATION SUCT STRKRFLW 2 WTP (MISCELLANEOUS) ×1 IMPLANT
KIT BASIN OR (CUSTOM PROCEDURE TRAY) ×2 IMPLANT
KIT TURNOVER KIT A (KITS) ×2 IMPLANT
LEGGING LITHOTOMY PAIR STRL (DRAPES) IMPLANT
PAD POSITIONING PINK XL (MISCELLANEOUS) IMPLANT
PLATFORM TRANSANAL ACCESS 4X5 (MISCELLANEOUS) ×1 IMPLANT
PLATFORM TRANSANAL ACCESS 4X5. (MISCELLANEOUS) ×2
SCISSORS LAP 5X35 DISP (ENDOMECHANICALS) IMPLANT
SEAL CANN UNIV 5-8 DVNC XI (MISCELLANEOUS) ×3 IMPLANT
SEAL XI 5MM-8MM UNIVERSAL (MISCELLANEOUS) ×6
SEALER VESSEL DA VINCI XI (MISCELLANEOUS)
SEALER VESSEL EXT DVNC XI (MISCELLANEOUS) IMPLANT
SET TRI-LUMEN FLTR TB AIRSEAL (TUBING) ×2 IMPLANT
SOLUTION ELECTROLUBE (MISCELLANEOUS) ×2 IMPLANT
STOPCOCK 4 WAY LG BORE MALE ST (IV SETS) ×4 IMPLANT
SURGILUBE 2OZ TUBE FLIPTOP (MISCELLANEOUS) ×2 IMPLANT
SUT PROLENE 0 SH 30 (SUTURE) ×2 IMPLANT
SUT SILK 0 SH 30 (SUTURE) IMPLANT
SUT V-LOC BARB 180 2/0GR6 GS22 (SUTURE) ×2
SUT VLOC BARB 180 ABS3/0GR12 (SUTURE)
SUTURE V-LC BRB 180 2/0GR6GS22 (SUTURE) ×1 IMPLANT
SUTURE VLOC BRB 180 ABS3/0GR12 (SUTURE) IMPLANT
SYR BULB IRRIG 60ML STRL (SYRINGE) IMPLANT
TOWEL OR 17X26 10 PK STRL BLUE (TOWEL DISPOSABLE) ×2 IMPLANT
TOWEL OR NON WOVEN STRL DISP B (DISPOSABLE) ×2 IMPLANT
TRAY FOLEY MTR SLVR 16FR STAT (SET/KITS/TRAYS/PACK) ×2 IMPLANT
TRAY LAPAROSCOPIC (CUSTOM PROCEDURE TRAY) ×2 IMPLANT
TROCAR ADV FIXATION 5X100MM (TROCAR) IMPLANT
TROCAR PORT AIRSEAL 5X120 (TROCAR) ×2 IMPLANT

## 2020-11-09 NOTE — Transfer of Care (Signed)
Immediate Anesthesia Transfer of Care Note  Patient: Joseph Vaughn  Procedure(s) Performed: ROBOTIC TAMIS, PARTIAL PROCTECTOMY, HEMORRHOID LIGATION AND PEXY X2 (N/A Rectum)  Patient Location: PACU  Anesthesia Type:General  Level of Consciousness: sedated, patient cooperative and responds to stimulation  Airway & Oxygen Therapy: Patient Spontanous Breathing and Patient connected to face mask oxygen  Post-op Assessment: Report given to RN and Post -op Vital signs reviewed and stable  Post vital signs: Reviewed and stable  Last Vitals:  Vitals Value Taken Time  BP 140/71 11/09/20 1504  Temp    Pulse 66 11/09/20 1506  Resp 17 11/09/20 1506  SpO2 100 % 11/09/20 1506  Vitals shown include unvalidated device data.  Last Pain:  Vitals:   11/09/20 1047  TempSrc:   PainSc: 0-No pain         Complications: No complications documented.

## 2020-11-09 NOTE — Op Note (Addendum)
11/09/2020  3:15 PM  PATIENT:  Joseph Vaughn  65 y.o. 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  This assistant was needed for exposure, dissection, suction, tissue approximation, retraction, perception, etc.  ANESTHESIA:   local and general  EBL:  Total I/O In: 1350 [I.V.:1000; IV Piggyback:350] Out: 12 [Blood:50]  Delay start of Pharmacological VTE agent (>24hrs) due to surgical blood loss or risk of bleeding:  no  DRAINS: none   SPECIMEN:  Distal rectal mass  (see OR findings below)  DISPOSITION OF SPECIMEN:  PATHOLOGY  COUNTS:  YES  PLAN OF CARE: Admit for overnight observation  PATIENT DISPOSITION:  PACU - hemodynamically stable.  INDICATION: Patient found to have distal rectal mass by endoscopy.  Biopsy consistent with adenomatous polyp.  Felt to be too large and distal to safely removed.  Surgical resection requested.  I offered a transanal resection using robotic assisted TAMIS technique  The anatomy & physiology of the digestive tract was discussed.  The pathophysiology of the rectal pathology was discussed.  Natural history risks without surgery was discussed.   I feel the risks of no intervention will lead to serious problems that outweigh the operative risks; therefore, I recommended surgery.     Laparoscopic & open abdominal techniques were discussed.  I recommended we start with a partial proctectomy by transanal endoscopic microsurgery (TEM) for excisional biopsy to remove the pathology and hopefully cure and/or control the pathology.  This technique can offer less operative risk and faster post-operative recovery.  Possible need for immediate or later abdominal surgery for further treatment was discussed.   Risks such as bleeding, abscess, reoperation, ostomy, heart attack, death, and other risks were discussed.   I noted a good likelihood this will help address the problem.  Goals of post-operative recovery were discussed as well.  We will work to minimize complications.  An educational handout was given as well.  Questions were answered.  The patient expresses understanding & wishes to proceed with surgery.  OR FINDINGS: Friable distal rectal tumor right lateral 4x3cm  The resulting mass was 4.5 cm in size  Pin placement on pathology specimen: Proximal margin: Pink Distal margin:Teal Right lateral: White Left lateral:  Yellow  The closure rests 0-2 cm from the anal verge in the posterior location.  It is 50% of the circumference.  Very large internal hemorrhoids partially prolapsing at least grade 3 if not grade 4 on the left side.  Right posterior hemorrhoidectomy done.  Left posterior lateral hemorrhoid ligation and pexy done with closure.   DESCRIPTION:   Informed consent was confirmed.  Patient received general anesthesia without difficulty.  Foley catheter sterilely placed.  Sequential compression devices active during the entire case.  The patient was placed in the low lithotomyposition, taking extra care to secure and protect the patient appropriately.  The perineum and perianal regions were prepped and draped in a sterile fashion.  Surgical timeout confirmed our plan.  I did a gentle digital rectal examination with gradual anal dilation to allow placement of the  Gelpoint system into the rectum.  This was additionally secured to the peritoneum using interrupted suture.  Placed a air seal port and insufflated to 20 mm to good result.  I placed a 8 mm robotic port anteriorly and used the robotic camera to confirm I had the area of concern in view and in the posterior "6:00" position.  Xi robot brought in and docked to the camera port.  I carefully placed additional ports right lateral and left lateral.  Robotic instruments placed.  I could easily identify the mass.  I went ahead and used cautery to mark 1 cm margins circumferentially.  I then did a full thickness transection.  Visualization was challenged by his large grade 3/4 internal hemorrhoids.  However I was able to focus on the proximal margin.  I was able to come around right lateral left lateral.  Most distal margin was involved with the largest right lateral hemorrhoid so I undocked the robot and we completed transanal extension using a large Sawyer retractor as well as a Gaffer.  I transected part of the large right posterior hemorrhoid for a margin distally.  Came over the sphincter complex into the posterior pelvis for a full-thickness resection.  Met up around the lateral and proximal margins.  Remove the specimens.  I did resect a more clean left proximal lateral  margin.  Also trimmed the distal hemorrhoid a little bit more.  I ensured hemostasis.  I inspected the main specimen and pinned it on thick cork board.  Pins as noted above.  I walked the specimen down to pathology and showed the [name] pathology team for proper orientation.    I went back in and scrubbed in.  Hemostasis was good.  I reapproximated the wound transversely.  I used a 2-0 V lock for the center part of posterior rectum to the right posterior midline hemorrhoid in a horizontal mattress fashion.  I then switched to V-lock serrated absorbable suture.  I closed the wound in a [place] running fashion from each corner, meeting  in the center of the closure & overlapping the running closure.  I used as a chance for hemorrhoidal ligation and pexy on the large left lateral pile as well.  I held off on doing extra hemorrhoidectomy since I did not want to cause too much resection and end up having to involve 50% of the rectum to close the wound.  This brought things together well.  I did meticulous inspection with fine tip instruments to confirm good watertight closure   Hemostasis excellent.  The lumen was quite patent.  Insufflation evacuated & instruments removed.  The patient is being extubated go to recovery room.  I discussed operative findings, updated the patient's status, discussed probable steps to recovery, and gave postoperative recommendations to the patient's spouse, Armed forces operational officer.  Recommendations were made.  Questions were answered.  She expressed understanding & appreciation.   [name], MD, FACS, MASCRS Gastrointestinal and Minimally Invasive Surgery    [address], [place], [place] [address] [phone] Main / Paging [phone] Fax

## 2020-11-09 NOTE — Anesthesia Procedure Notes (Signed)
Procedure Name: Intubation Performed by: Gean Maidens, CRNA Pre-anesthesia Checklist: Patient identified, Emergency Drugs available, Suction available, Patient being monitored and Timeout performed Patient Re-evaluated:Patient Re-evaluated prior to induction Oxygen Delivery Method: Circle system utilized Preoxygenation: Pre-oxygenation with 100% oxygen Induction Type: IV induction Ventilation: Mask ventilation without difficulty Laryngoscope Size: Mac and 4 Grade View: Grade II Tube type: Oral Tube size: 7.5 mm Number of attempts: 1 Airway Equipment and Method: Stylet Placement Confirmation: ETT inserted through vocal cords under direct vision,  positive ETCO2 and breath sounds checked- equal and bilateral Secured at: 23 cm Tube secured with: Tape Dental Injury: Teeth and Oropharynx as per pre-operative assessment

## 2020-11-09 NOTE — Discharge Instructions (Signed)
ANORECTAL SURGERY:  POST OPERATIVE INSTRUCTIONS  ######################################################################  EAT Start with a pureed / full liquid diet After 24 hours, gradually transition to a high fiber diet.    CONTROL PAIN Control pain so you can tolerate bowel movements,  walk, sleep, tolerate sneezing/coughing, and go up/down stairs.   HAVE A BOWEL MOVEMENT DAILY Keep your bowels regular to avoid problems.   Taking a fiber supplement every day to keep bowels soft.   Try a laxative to override constipation. Use an antidairrheal to slow down diarrhea.   Call if not better after 2 tries  WALK Walk an hour a day.  Control your pain to do that.   CALL IF YOU HAVE PROBLEMS/CONCERNS Call if you are still struggling despite following these instructions. Call if you have concerns not answered by these instructions  ######################################################################    1. Take your usually prescribed home medications unless otherwise directed.  2. DIET: Follow a light bland diet & liquids the first 24 hours after arrival home, such as soup, liquids, starches, etc.  Be sure to drink plenty of fluids.  Quickly advance to a usual solid diet within a few days.  Avoid fast food or heavy meals as your are more likely to get nauseated or have irregular bowels.  A low-fat, high-fiber diet for the rest of your life is ideal.  3. PAIN CONTROL: a. Pain is best controlled by a usual combination of three different methods TOGETHER: i. Ice/Heat ii. Over the counter pain medication iii. Prescription pain medication b. Expect swelling and discomfort in the anus/rectal area.  Warm water baths (30-60 minutes up to 6 times a day, especially after bowel meovements) will help. Use ice for the first few days to help decrease swelling and bruising, then switch to heat such as warm towels, sitz baths, warm baths, etc to help relax tight/sore spots and speed recovery.   Some people prefer to use ice alone, heat alone, alternating between ice & heat.  Experiment to what works for you.   c. It is helpful to take an over-the-counter pain medication continuously for the first few weeks.  Choose one of the following that works best for you: i. Naproxen (Aleve, etc)  Two 250m tabs twice a day ii. Ibuprofen (Advil, etc) Three 2072mtabs four times a day (every meal & bedtime) iii. Acetaminophen (Tylenol, etc) 500-65044mour times a day (every meal & bedtime) d. A  prescription for pain medication (such as oxycodone, hydrocodone, etc) should be given to you upon discharge.  Take your pain medication as prescribed.  i. If you are having problems/concerns with the prescription medicine (does not control pain, nausea, vomiting, rash, itching, etc), please call us [phone] see if we need to switch you to a different pain medicine that will work better for you and/or control your side effect better. ii. If you need a refill on your pain medication, please contact your pharmacy.  They will contact our office to request authorization. Prescriptions will not be filled after 5 pm or on week-ends.  If can take up to 48 hours for it to be filled & ready so avoid waiting until you are down to thel ast pill. e. A topical cream (Dibucaine) or a prescription for a cream (such as diltiazem 2% gel) may be given to you.  Many people find relief with topical creams.  Some people find it burns too much.  Experiment.  If it helps, use it.  If it burns, don't using  it.  Use a Sitz Bath 4-8 times a day for relief   [employer] A sitz bath is a warm water bath taken in the sitting position that covers only the hips and buttocks. It may be used for either healing or hygiene purposes. Sitz baths are also used to relieve pain, itching, or muscle spasms. The water may contain medicine. Moist heat will help you heal and relax.  HOME CARE INSTRUCTIONS  Take 3 to 4 sitz baths a day. 1. Fill the  bathtub half full with warm water. 2. Sit in the water and open the drain a little. 3. Turn on the warm water to keep the tub half full. Keep the water running constantly. 4. Soak in the water for 15 to 20 minutes. 5. After the sitz bath, pat the affected area dry first.   4. KEEP YOUR BOWELS REGULAR a. The goal is one soft bowel movement a day b. Avoid getting constipated.  Between the surgery and the pain medications, it is common to experience some constipation.  Increasing fluid intake and taking a fiber supplement (such as Metamucil, Citrucel, FiberCon, MiraLax, etc) 2-3 times a day regularly will usually help prevent this problem from occurring.  A mild laxative (prune juice, Milk of Magnesia, MiraLax, etc) should be taken according to package directions if there are no bowel movements after 48 hours. c. Watch out for diarrhea.  If you have many loose bowel movements, simplify your diet to bland foods & liquids for a few days.  Stop any stool softeners and decrease your fiber supplement.  Switching to mild anti-diarrheal medications (Kayopectate, Pepto Bismol) can help.  Can try an imodium/loperamide dose.  If this worsens or does not improve, please call us.  5. Wound Care  a. Remove your bandages with your first bowel movement, usually the day after surgery.  Let the gauze fall off with the first bowel movement or shower.   b. Wear an absorbent pad or soft cotton balls in your underwear as needed to catch any drainage and help keep the area  c. Keep the area clean and dry.  Bathe / shower every day.  Keep the area clean by showering / bathing over the incision / wound.   It is okay to soak an open wound to help wash it.  Consider using a squeeze bottle filled with warm water to gently wash the anal area.  Wet wipes or showers / gentle washing after bowel movements is often less traumatic than regular toilet paper. d. Dennis Bast will often notice bleeding with bowel movements.  This should slow down  by the end of the first week of surgery.  Sitting on an ice pack can help. e. Expect some drainage.  This should slow down by the end of the first week of surgery, but you will have occasional bleeding or drainage up to a few months after surgery.  Wear an absorbent pad or soft cotton gauze in your underwear until the drainage stops.  6. ACTIVITIES as tolerated:   a. You may resume regular (light) daily activities beginning the next day--such as daily self-care, walking, climbing stairs--gradually increasing activities as tolerated.  If you can walk 30 minutes without difficulty, it is safe to try more intense activity such as jogging, treadmill, bicycling, low-impact aerobics, swimming, etc. b. Save the most intensive and strenuous activity for last such as sit-ups, heavy lifting, contact sports, etc  Refrain from any heavy lifting or straining until you are off narcotics for  pain control.   c. DO NOT PUSH THROUGH PAIN.  Let pain be your guide: If it hurts to do something, don't do it.  Pain is your body warning you to avoid that activity for another week until the pain goes down. d. You may drive when you are no longer taking prescription pain medication, you can comfortably sit for long periods of time, and you can safely maneuver your car and apply brakes. e. Dennis Bast may have sexual intercourse when it is comfortable.  7. FOLLOW UP in our office a. Please call [facility] at [phone] to set up an appointment to see your surgeon in the office for a follow-up appointment approximately 2-3 weeks after your surgery. b. Make sure that you call for this appointment the day you arrive home to ensure a convenient appointment time.  8. IF YOU HAVE DISABILITY OR FAMILY LEAVE FORMS, BRING THEM TO THE OFFICE FOR PROCESSING.  DO NOT GIVE THEM TO YOUR DOCTOR.        WHEN TO CALL us [phone]: 1. Poor pain control 2. Reactions / problems with new medications (rash/itching, nausea, etc)  3. Fever over  101.5 F (38.5 C) 4. Inability to urinate 5. Nausea and/or vomiting 6. Worsening swelling or bruising 7. Continued bleeding from incision. 8. Increased pain, redness, or drainage from the incision  The clinic staff is available to answer your questions during regular business hours (8:30am-5pm).  Please don't hesitate to call and ask to speak to one of our nurses for clinical concerns.   A surgeon from [facility] is always on call at the hospitals   If you have a medical emergency, go to the nearest emergency room or call 911.    [facility], [place], [place], [place], [place]  [address] ? MAIN: [phone] ? TOLL FREE: [phone] ? FAX [phone] [email or web address]     TRANSANAL ENDOSCOPIC MICROSURGERY (TEM)  Transanal endoscopic microsurgery (TEM) was developed as a means to provide a less invasive way to operate on the rectum.  This may needed to provide a good resection of part of the rectal wall to remove a large pre-cancerous polyp or an early cancer in which the patient cannot tolerate an open surgery or has an extremely hostile abdomen that makes the resection very risky.    The rectum is the last foot of the tubular digestive tract.  It resides in the pelvis, laying on top of your tailbone.  It is the final reservoir for stool before it is evacuated through the anus in the process of defecation, naturally stretching and contracting to allow time for someone to control bowel function.  The rectum is a common location for polyps or even a cancer.    In most cases when a polyp is found in the colon or rectum, a person often does not need to have a large resection of the rectum, but have it excised by a colonoscope.  However, some polyps are too large to be safely removed through endoscopy and require surgery.  Classically, this is done through an open incision where a low anterior resection or abdominoperineal resection  where part or the entire rectum is removed.  Often, only part of a wall of the rectum needs to be removed.   Transanal endoscopic microsurgery (TEM) was developed as a means to provide a less invasive way to operate on the rectum.    Transanal endoscopic microsurgery (TEM) involves the patient to be placed  under complete general anesthesia.  The anus is gently dilated and a metal tube is placed into the rectum.  Through the tube, absorbable gas is infused and long instruments are used to help access and cut out the polyp or tumor from part of the rectum.  The long instruments are also used to help sew the wound shut.  The specimen is then sent for pathology.  The procedure itself usually takes a few hours of time.  The patient stays at least overnight.  When they can tolerate a regular diet and have adequate pain control they usually leave the hospital in one or two days.    The advantage of TEM is that as opposed to a long hospital stay, patient recovery is much faster.  People  less likely to have bowel or other problems.  Careful pre-operative selection is essential to make sure that the patient is an appropriate candidate for the surgery.  Tumors or cancers that are very large or invasive usually are much more difficult to remove by this technique and are not considered the first option.  Persons who are most appropriate for this surgery are those with large polyps that have not become cancers or early cancers in patients who have high risks with larger surgery.   Sometimes a more aggressive laparoscopic or open follow up resection is needed if a cancer is found to give the best chance at pure.    In general, surgery has a better chance at cure if a cancer is found but may not needed if it is only a precancerous polyp.  Risks to the surgery such as pain, bleeding, abscess, leak, ostomy, and death are inherent but overall the TEM procedure is less stressful and less risky to the patient than a classic  colorectal resection throughthe abdomen.  Your colorectal surgeon can help decide what option is best for you.

## 2020-11-09 NOTE — Anesthesia Preprocedure Evaluation (Addendum)
Anesthesia Evaluation  Patient identified by MRN, date of birth, ID band Patient awake    Reviewed: Allergy & Precautions, NPO status , Patient's Chart, lab work & pertinent test results, reviewed documented beta blocker date and time   History of Anesthesia Complications Negative for: history of anesthetic complications  Airway Mallampati: II  TM Distance: >3 FB Neck ROM: Full    Dental no notable dental hx. (+) Dental Advisory Given   Pulmonary Current Smoker and Patient abstained from smoking.,    Pulmonary exam normal        Cardiovascular + angina + CAD, + Past MI (2019) and + Cardiac Stents (2019)  Normal cardiovascular exam  Known LBBB  TTE 2021 1. Left ventricular ejection fraction, by estimation, is 55 to 60%. The left ventricle has normal function. The left ventrical has no regional wall motion abnormalities. There is mildly increased left ventricular hypertrophy. Left ventricular diastolic parameters are consistent with Grade I diastolic dysfunction (impaired  relaxation).  2. Right ventricular systolic function is normal. The right ventricular size is normal. There is mildly elevated pulmonary artery systolic pressure.  3. Mild mitral valve regurgitation.  4. The aortic valve is tricuspid.  5. Limited echo to evaluate LV function   LHC 2020 Previously placed Prox LAD to Mid LAD drug eluting stent is widely patent. Balloon angioplasty was performed. Dist RCA lesion is 30% stenosed. Previously placed Dist Cx stent (unknown type) is widely patent. The left ventricular systolic function is normal. LV end diastolic pressure is normal. The left ventricular ejection fraction is 55-65% by visual estimate.   Previously placed Prox LAD to Mid LAD drug eluting stent is widely patent.  Balloon angioplasty was performed.  Dist RCA lesion is 30% stenosed.  Previously placed Dist Cx stent (unknown type) is  widely patent.  The left ventricular systolic function is normal.  LV end diastolic pressure is normal.  The left ventricular ejection fraction is 55-65% by visual estimate.   1.  Widely patent LAD and left circumflex stents with no evidence of obstructive coronary artery disease. 2.  Normal LV systolic function and left ventricular end-diastolic pressure.     Neuro/Psych  Headaches, PSYCHIATRIC DISORDERS Depression Bipolar Disorder    GI/Hepatic Neg liver ROS, GERD  Medicated and Controlled,  Endo/Other  negative endocrine ROS  Renal/GU Renal InsufficiencyRenal disease  negative genitourinary   Musculoskeletal negative musculoskeletal ROS (+)   Abdominal   Peds  Hematology negative hematology ROS (+)   Anesthesia Other Findings   Reproductive/Obstetrics                           Anesthesia Physical Anesthesia Plan  ASA: III  Anesthesia Plan: General   Post-op Pain Management:    Induction: Intravenous  PONV Risk Score and Plan: 1 and Midazolam, Dexamethasone and Ondansetron  Airway Management Planned: Oral ETT  Additional Equipment:   Intra-op Plan:   Post-operative Plan: Extubation in OR  Informed Consent: I have reviewed the patients History and Physical, chart, labs and discussed the procedure including the risks, benefits and alternatives for the proposed anesthesia with the patient or authorized representative who has indicated his/her understanding and acceptance.     Dental advisory given  Plan Discussed with: CRNA  Anesthesia Plan Comments:         Anesthesia Quick Evaluation

## 2020-11-09 NOTE — Anesthesia Postprocedure Evaluation (Signed)
Anesthesia Post Note  Patient: Joseph Vaughn  Procedure(s) Performed: ROBOTIC TAMIS, PARTIAL PROCTECTOMY, HEMORRHOID LIGATION AND PEXY X2 (N/A Rectum)     Patient location during evaluation: PACU Anesthesia Type: General Level of consciousness: sedated Pain management: pain level controlled Vital Signs Assessment: post-procedure vital signs reviewed and stable Respiratory status: spontaneous breathing and respiratory function stable Cardiovascular status: stable Postop Assessment: no apparent nausea or vomiting Anesthetic complications: no   No complications documented.  Last Vitals:  Vitals:   11/09/20 1733 11/09/20 1938  BP: (!) 143/77 135/70  Pulse: (!) 58 65  Resp: 18 17  Temp:  (!) 36.4 C  SpO2: 98% 100%    Last Pain:  Vitals:   11/09/20 2015  TempSrc:   PainSc: 0-No pain                 Jayce Boyko DANIEL

## 2020-11-09 NOTE — Interval H&P Note (Signed)
History and Physical Interval Note:  11/09/2020 11:43 AM  Joseph Vaughn  has presented today for surgery, with the diagnosis of RECTAL POLYP.  The various methods of treatment have been discussed with the patient and family. After consideration of risks, benefits and other options for treatment, the patient has consented to  Procedure(s): ROBOTIC TAMIS PARTIAL PROCTECTOMY OF RECTAL MASS (N/A) as a surgical intervention.  The patient's history has been reviewed, patient examined, no change in status, stable for surgery.  I have reviewed the patient's chart and labs.  Questions were answered to the patient's satisfaction.    The anatomy & physiology of the digestive tract was discussed.  The pathophysiology of the rectal pathology was discussed.  Natural history risks without surgery was discussed.   I feel the risks of no intervention will lead to serious problems that outweigh the operative risks; therefore, I recommended surgery.    Laparoscopic & open abdominal techniques were discussed.  I recommended we start with a partial proctectomy by transanal endoscopic microsurgery (TEM) for excisional biopsy to remove the pathology and hopefully cure and/or control the pathology.  This technique can offer less operative risk and faster post-operative recovery.  Possible need for immediate or later abdominal surgery for further treatment was discussed.   Risks such as bleeding, abscess, reoperation, ostomy, heart attack, death, and other risks were discussed.   I noted a good likelihood this will help address the problem.  Goals of post-operative recovery were discussed as well.  We will work to minimize complications.  An educational handout was given as well.  Questions were answered.  The patient expresses understanding & wishes to proceed with surgery.    [name]

## 2020-11-10 ENCOUNTER — Encounter (HOSPITAL_COMMUNITY): Payer: Self-pay | Admitting: Surgery

## 2020-11-10 DIAGNOSIS — K621 Rectal polyp: Secondary | ICD-10-CM | POA: Diagnosis not present

## 2020-11-10 DIAGNOSIS — Z7901 Long term (current) use of anticoagulants: Secondary | ICD-10-CM | POA: Diagnosis not present

## 2020-11-10 DIAGNOSIS — K648 Other hemorrhoids: Secondary | ICD-10-CM | POA: Diagnosis not present

## 2020-11-10 DIAGNOSIS — I2510 Atherosclerotic heart disease of native coronary artery without angina pectoris: Secondary | ICD-10-CM | POA: Diagnosis not present

## 2020-11-10 DIAGNOSIS — F17200 Nicotine dependence, unspecified, uncomplicated: Secondary | ICD-10-CM | POA: Diagnosis not present

## 2020-11-10 DIAGNOSIS — D128 Benign neoplasm of rectum: Secondary | ICD-10-CM | POA: Diagnosis not present

## 2020-11-10 LAB — BASIC METABOLIC PANEL
Anion gap: 7 (ref 5–15)
BUN: 13 mg/dL (ref 8–23)
CO2: 26 mmol/L (ref 22–32)
Calcium: 9.1 mg/dL (ref 8.9–10.3)
Chloride: 104 mmol/L (ref 98–111)
Creatinine, Ser: 1.86 mg/dL — ABNORMAL HIGH (ref 0.61–1.24)
GFR, Estimated: 40 mL/min — ABNORMAL LOW (ref >60)
Glucose, Bld: 114 mg/dL — ABNORMAL HIGH (ref 70–99)
Potassium: 4.8 mmol/L (ref 3.5–5.1)
Sodium: 137 mmol/L (ref 135–145)

## 2020-11-10 LAB — MAGNESIUM: Magnesium: 2 mg/dL (ref 1.7–2.4)

## 2020-11-10 LAB — CBC
HCT: 34.4 % — ABNORMAL LOW (ref 39.0–52.0)
Hemoglobin: 11.3 g/dL — ABNORMAL LOW (ref 13.0–17.0)
MCH: 30.8 pg (ref 26.0–34.0)
MCHC: 32.8 g/dL (ref 30.0–36.0)
MCV: 93.7 fL (ref 80.0–100.0)
Platelets: 163 10*3/uL (ref 150–400)
RBC: 3.67 MIL/uL — ABNORMAL LOW (ref 4.22–5.81)
RDW: 12.6 % (ref 11.5–15.5)
WBC: 13.1 10*3/uL — ABNORMAL HIGH (ref 4.0–10.5)
nRBC: 0 % (ref 0.0–0.2)

## 2020-11-10 NOTE — Progress Notes (Signed)
Discharge instructions given to patient and all questions were answered.  
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
## 2020-11-11 LAB — SURGICAL PATHOLOGY

## 2020-12-01 DIAGNOSIS — D485 Neoplasm of uncertain behavior of skin: Secondary | ICD-10-CM | POA: Diagnosis not present

## 2020-12-01 DIAGNOSIS — B078 Other viral warts: Secondary | ICD-10-CM | POA: Diagnosis not present

## 2020-12-01 DIAGNOSIS — L989 Disorder of the skin and subcutaneous tissue, unspecified: Secondary | ICD-10-CM | POA: Diagnosis not present

## 2020-12-01 DIAGNOSIS — L820 Inflamed seborrheic keratosis: Secondary | ICD-10-CM | POA: Diagnosis not present

## 2021-01-27 DIAGNOSIS — M79671 Pain in right foot: Secondary | ICD-10-CM | POA: Diagnosis not present

## 2021-01-27 DIAGNOSIS — M7671 Peroneal tendinitis, right leg: Secondary | ICD-10-CM | POA: Diagnosis not present

## 2021-06-15 ENCOUNTER — Ambulatory Visit (INDEPENDENT_AMBULATORY_CARE_PROVIDER_SITE_OTHER): Payer: BC Managed Care – PPO

## 2021-06-15 ENCOUNTER — Ambulatory Visit (INDEPENDENT_AMBULATORY_CARE_PROVIDER_SITE_OTHER): Payer: BC Managed Care – PPO | Admitting: Nurse Practitioner

## 2021-06-15 ENCOUNTER — Other Ambulatory Visit: Payer: Self-pay

## 2021-06-15 VITALS — BP 118/72 | HR 68 | Temp 96.5°F | Resp 10 | Ht 65.0 in | Wt 168.1 lb

## 2021-06-15 DIAGNOSIS — K4090 Unilateral inguinal hernia, without obstruction or gangrene, not specified as recurrent: Secondary | ICD-10-CM

## 2021-06-15 DIAGNOSIS — N50811 Right testicular pain: Secondary | ICD-10-CM | POA: Diagnosis not present

## 2021-06-15 DIAGNOSIS — R109 Unspecified abdominal pain: Secondary | ICD-10-CM

## 2021-06-15 DIAGNOSIS — R3129 Other microscopic hematuria: Secondary | ICD-10-CM

## 2021-06-15 DIAGNOSIS — R1030 Lower abdominal pain, unspecified: Secondary | ICD-10-CM

## 2021-06-15 DIAGNOSIS — N529 Male erectile dysfunction, unspecified: Secondary | ICD-10-CM

## 2021-06-15 LAB — POCT URINALYSIS DIPSTICK
Bilirubin, UA: NEGATIVE
Glucose, UA: NEGATIVE
Ketones, UA: NEGATIVE
Leukocytes, UA: NEGATIVE
Nitrite, UA: 0.2
Protein, UA: POSITIVE — AB
Spec Grav, UA: 1.025 (ref 1.010–1.025)
Urobilinogen, UA: NEGATIVE E.U./dL — AB
pH, UA: 6 (ref 5.0–8.0)

## 2021-06-15 MED ORDER — SILDENAFIL CITRATE 25 MG PO TABS: 25.0000 mg | ORAL_TABLET | ORAL | 0 refills | Status: DC | PRN

## 2021-06-15 NOTE — Assessment & Plan Note (Signed)
Patient having right-sided flank pain but does have a history of nephrolithiasis with large left-sided kidney stone and has not had follow-up 1.  We will send off urine for microscopy to make sure there is blood noted Also for culture.  Pending results.
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
## 2021-06-15 NOTE — Patient Instructions (Addendum)
Nice to see you today I sent in medication to help. YOU CANNOT take this and the nitroglycerin. This will cause a very large drop in your blood pressure. I will be in touch with the labs and xray results Follow up if no improvement or symptoms worsen

## 2021-06-15 NOTE — Assessment & Plan Note (Signed)
Patient states is a chronic pain in his right testicle is always bothered him.  Little more today do think patient may be experiencing a kidney stone testicular exam benign.  Pending lab and x-ray results

## 2021-06-15 NOTE — Assessment & Plan Note (Signed)
Hernia right inguinal area.  No further work-up needed at this time.  Is not incarcerated.  Continue to monitor

## 2021-06-15 NOTE — Assessment & Plan Note (Signed)
Patient having lower abdominal pain states still there but greatly improved states he had bowel movements over the last approximate 5 days.  Obtain abdominal 2 view.  Pending x-ray result

## 2021-06-15 NOTE — Assessment & Plan Note (Signed)
Patient states he has been doing this for some time.  States he got about 40% of an erection not hard enough for penetration or completion of sexual intercourse.  We will trial a low-dose of sildenafil 25 mg.  Did tell patient start with 1 tablet may increase to 2 tablets if not giving response.  Patient does not have nitroglycerin at home per his report strictly told him not allowed to take nitroglycerin and sildenafil together as it will cause hypotension that can require hospitalization patient acknowledged.

## 2021-06-16 LAB — URINE CULTURE
MICRO NUMBER:: 12831996
SPECIMEN QUALITY:: ADEQUATE

## 2021-06-16 LAB — COMPREHENSIVE METABOLIC PANEL
ALT: 33 U/L (ref 0–53)
AST: 29 U/L (ref 0–37)
Albumin: 4.4 g/dL (ref 3.5–5.2)
Alkaline Phosphatase: 66 U/L (ref 39–117)
BUN: 14 mg/dL (ref 6–23)
CO2: 30 mEq/L (ref 19–32)
Calcium: 9.7 mg/dL (ref 8.4–10.5)
Chloride: 103 mEq/L (ref 96–112)
Creatinine, Ser: 2 mg/dL — ABNORMAL HIGH (ref 0.40–1.50)
GFR: 34.53 mL/min — ABNORMAL LOW (ref >60.00)
Glucose, Bld: 82 mg/dL (ref 70–99)
Potassium: 4.8 mEq/L (ref 3.5–5.1)
Sodium: 140 mEq/L (ref 135–145)
Total Bilirubin: 0.4 mg/dL (ref 0.2–1.2)
Total Protein: 6.9 g/dL (ref 6.0–8.3)

## 2021-06-16 LAB — URINALYSIS, MICROSCOPIC ONLY

## 2021-06-16 LAB — CBC WITH DIFFERENTIAL/PLATELET
Basophils Absolute: 0.1 10*3/uL (ref 0.0–0.1)
Basophils Relative: 1.2 % (ref 0.0–3.0)
Eosinophils Absolute: 0.3 10*3/uL (ref 0.0–0.7)
Eosinophils Relative: 2.9 % (ref 0.0–5.0)
HCT: 44.7 % (ref 39.0–52.0)
Hemoglobin: 14.8 g/dL (ref 13.0–17.0)
Lymphocytes Relative: 16.3 % (ref 12.0–46.0)
Lymphs Abs: 1.8 10*3/uL (ref 0.7–4.0)
MCHC: 33.1 g/dL (ref 30.0–36.0)
MCV: 92.6 fl (ref 78.0–100.0)
Monocytes Absolute: 0.9 10*3/uL (ref 0.1–1.0)
Monocytes Relative: 8.7 % (ref 3.0–12.0)
Neutro Abs: 7.7 10*3/uL (ref 1.4–7.7)
Neutrophils Relative %: 70.9 % (ref 43.0–77.0)
Platelets: 262 10*3/uL (ref 150.0–400.0)
RBC: 4.83 Mil/uL (ref 4.22–5.81)
RDW: 13.8 % (ref 11.5–15.5)
WBC: 10.9 10*3/uL — ABNORMAL HIGH (ref 4.0–10.5)

## 2021-06-16 LAB — LIPASE: Lipase: 42 U/L (ref 11.0–59.0)

## 2021-06-17 ENCOUNTER — Other Ambulatory Visit: Payer: Self-pay | Admitting: Nurse Practitioner

## 2021-06-17 DIAGNOSIS — N200 Calculus of kidney: Secondary | ICD-10-CM

## 2021-06-17 MED ORDER — HYDROCODONE-ACETAMINOPHEN 5-325 MG PO TABS: 1.0000 | ORAL_TABLET | Freq: Three times a day (TID) | ORAL | 0 refills | Status: AC | PRN

## 2021-06-21 ENCOUNTER — Other Ambulatory Visit: Payer: Self-pay | Admitting: Cardiovascular Disease

## 2021-07-18 ENCOUNTER — Telehealth: Payer: Self-pay | Admitting: Nurse Practitioner

## 2021-07-18 NOTE — Telephone Encounter (Signed)
Pt is wanting a call back concerning his most recent lab results. Please advise at [phone]

## 2021-07-20 NOTE — Telephone Encounter (Signed)
Spoke with patient. Patient stated he did not hear back about his results from January. Looked back and advised patient we have notes that Romilda Garret called and spoke to him on 06/16/21 and I asked if he recalled that, patient stated he remembers now. No further questions
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
## 2021-08-02 ENCOUNTER — Other Ambulatory Visit: Payer: Self-pay

## 2021-08-02 ENCOUNTER — Ambulatory Visit (INDEPENDENT_AMBULATORY_CARE_PROVIDER_SITE_OTHER): Payer: Medicare Other | Admitting: Cardiovascular Disease

## 2021-08-02 ENCOUNTER — Encounter: Payer: Self-pay | Admitting: Cardiovascular Disease

## 2021-08-02 VITALS — BP 144/82 | HR 68 | Ht 65.0 in | Wt 170.0 lb

## 2021-08-02 DIAGNOSIS — Z122 Encounter for screening for malignant neoplasm of respiratory organs: Secondary | ICD-10-CM

## 2021-08-02 DIAGNOSIS — I447 Left bundle-branch block, unspecified: Secondary | ICD-10-CM

## 2021-08-02 DIAGNOSIS — Z720 Tobacco use: Secondary | ICD-10-CM | POA: Diagnosis not present

## 2021-08-02 MED ORDER — NITROGLYCERIN 0.4 MG SL SUBL: 0.4000 mg | SUBLINGUAL_TABLET | SUBLINGUAL | 12 refills | Status: AC | PRN

## 2021-08-02 NOTE — Patient Instructions (Signed)
.  Medication Instructions:  Your physician recommends that you continue on your current medications as directed. Please refer to the Current Medication list given to you today.   Labwork: None today  Testing/Procedures: None   Follow-Up: 6 months  Any Other Special Instructions Will Be Listed Below (If Applicable).  If you need a refill on your cardiac medications before your next appointment, please call your pharmacy.

## 2021-09-13 ENCOUNTER — Other Ambulatory Visit: Payer: Self-pay | Admitting: Gastroenterology

## 2021-09-13 DIAGNOSIS — K219 Gastro-esophageal reflux disease without esophagitis: Secondary | ICD-10-CM

## 2021-09-23 ENCOUNTER — Other Ambulatory Visit: Payer: Self-pay | Admitting: Cardiovascular Disease

## 2021-10-05 ENCOUNTER — Other Ambulatory Visit: Payer: Self-pay | Admitting: Cardiovascular Disease

## 2021-10-24 ENCOUNTER — Telehealth: Payer: Self-pay

## 2021-10-24 NOTE — Telephone Encounter (Addendum)
Noted.  Thanks. If he can't come into clinic today then appropriate to give ER/UC cautions.  Can he do a video visit in the meantime?  ?

## 2021-10-24 NOTE — Telephone Encounter (Signed)
I spoke with pt; pt said has hernia; last seen on 06/15/21.area in lower lt abd more painful due to hernia; pain level now 5. Pt also having back pain on rt lower back pain to rt hip and pain radiates down rt leg. If still no pain but when up pain level is 9. No known injury to back. Offered pt an appt today but pt wants to see Dr Damita Dunnings and does not have transportation until Friday. Appt scheduled with Dr Damita Dunnings on 10/27/21 at 11:30 with Dr Damita Dunnings. UC & ED precautions given and pt voiced understanding. Sending note to Dr Damita Dunnings and Janett Billow CMA. ?

## 2021-10-24 NOTE — Telephone Encounter (Signed)
[facility] [facility] - Client ?Nonclinical Telephone Record  ?[facility]? ?Client [facility] - Client ?Client Site [place] ?Provider Renford Dills - MD ?Contact Type Call ?Who Is Calling Patient / Member / Family / Caregiver ?Caller Name Lenoard Helbert ?Caller Phone Number [phone] ?Patient Name Joseph Vaughn ?Patient DOB 09/10/1955 ?Call Type Message Only Information Provided ?Reason for Call Request to Schedule Office Appointment ?Initial Comment Caller states wanting to set up an appointment. ?Patient request to speak to RN No ?Additional Comment Provided office hours. ?Disp. Time Disposition Final User ?10/23/2021 5:08:51 PM General Information Provided Yes [place], [place] ?Call Closed By: Gracy Bruins ?Transaction Date/Time: 10/23/2021 5:04:13 PM (ET ?

## 2021-10-25 NOTE — Telephone Encounter (Signed)
LMTCB to see if patient would like sooner appt with another provider ?

## 2021-10-27 ENCOUNTER — Encounter: Payer: Self-pay | Admitting: Family Medicine

## 2021-10-27 ENCOUNTER — Ambulatory Visit (INDEPENDENT_AMBULATORY_CARE_PROVIDER_SITE_OTHER): Payer: Medicare Other | Admitting: Family Medicine

## 2021-10-27 ENCOUNTER — Ambulatory Visit (INDEPENDENT_AMBULATORY_CARE_PROVIDER_SITE_OTHER)
Admission: RE | Admit: 2021-10-27 | Discharge: 2021-10-27 | Disposition: A | Discharge status: Home / Self Care | Payer: Medicare Other | Source: Ambulatory Visit | Attending: Family Medicine | Admitting: Family Medicine

## 2021-10-27 VITALS — BP 120/76 | HR 81 | Temp 97.2°F | Ht 65.0 in | Wt 170.0 lb

## 2021-10-27 DIAGNOSIS — M545 Low back pain, unspecified: Secondary | ICD-10-CM

## 2021-10-27 DIAGNOSIS — N200 Calculus of kidney: Secondary | ICD-10-CM | POA: Diagnosis not present

## 2021-10-27 DIAGNOSIS — E7800 Pure hypercholesterolemia, unspecified: Secondary | ICD-10-CM

## 2021-10-27 DIAGNOSIS — F3160 Bipolar disorder, current episode mixed, unspecified: Secondary | ICD-10-CM

## 2021-10-27 DIAGNOSIS — E785 Hyperlipidemia, unspecified: Secondary | ICD-10-CM

## 2021-10-27 DIAGNOSIS — N50811 Right testicular pain: Secondary | ICD-10-CM

## 2021-10-27 LAB — COMPREHENSIVE METABOLIC PANEL
ALT: 21 U/L (ref 0–53)
AST: 24 U/L (ref 0–37)
Albumin: 4.4 g/dL (ref 3.5–5.2)
Alkaline Phosphatase: 65 U/L (ref 39–117)
BUN: 14 mg/dL (ref 6–23)
CO2: 30 mEq/L (ref 19–32)
Calcium: 9.6 mg/dL (ref 8.4–10.5)
Chloride: 102 mEq/L (ref 96–112)
Creatinine, Ser: 1.69 mg/dL — ABNORMAL HIGH (ref 0.40–1.50)
GFR: 42.15 mL/min — ABNORMAL LOW (ref >60.00)
Glucose, Bld: 97 mg/dL (ref 70–99)
Potassium: 4.6 mEq/L (ref 3.5–5.1)
Sodium: 139 mEq/L (ref 135–145)
Total Bilirubin: 0.4 mg/dL (ref 0.2–1.2)
Total Protein: 7.4 g/dL (ref 6.0–8.3)

## 2021-10-27 LAB — LIPID PANEL
Cholesterol: 130 mg/dL (ref 0–200)
HDL: 39 mg/dL — ABNORMAL LOW (ref >39.00)
LDL Cholesterol: 54 mg/dL (ref 0–99)
NonHDL: 91.13
Total CHOL/HDL Ratio: 3
Triglycerides: 186 mg/dL — ABNORMAL HIGH (ref 0.0–149.0)
VLDL: 37.2 mg/dL (ref 0.0–40.0)

## 2021-10-27 MED ORDER — GABAPENTIN 100 MG PO CAPS: 100.0000 mg | ORAL_CAPSULE | Freq: Three times a day (TID) | ORAL | 1 refills | Status: DC

## 2021-10-27 NOTE — Patient Instructions (Addendum)
Go to the lab on the way out.   If you have mychart we'll likely use that to update you.    Labs and xray today.   Gradually increase dose of gabapentin if needed/tolerated.   Take care.  Glad to see you.

## 2021-10-27 NOTE — Progress Notes (Signed)
Smoking d/w pt.  Minimal use recently but has episodes with more use, ie irregular use.  < 1 PPD. Recently cut back on beer.  Encouraged to taper and cessation of both.  D/w pt about recheck Cr.  Rare Nsaid use with recent abd pain.  Nsaid cautions d/w pt.    R inguinal pain, R lower back pain.  No L sided pain.  Can radiate down the R leg.  No FCNAVD.  Sx going on for 2 weeks now but had happened prior, months prior.  R sided testicle pain- that is long standing.    Meds, vitals, and allergies reviewed.   ROS: Per HPI unless specifically indicated in ROS section   Nad Ncat Neck supple, no LA Rrr Ctab Abd soft, not ttp, normal BS R testicle sore to light touch w/o mass seen. I can't feel a hernia at time of exam but exam limited by testicle pain.   Lower back not ttp.   Able to bear weight.  No foot drop.

## 2021-10-29 DIAGNOSIS — M545 Low back pain, unspecified: Secondary | ICD-10-CM | POA: Insufficient documentation

## 2021-10-29 NOTE — Assessment & Plan Note (Addendum)
Low back pain and testicle pain/inguinal pain.  Discussed with patient about options.  Given the testicle pain, regardless of anything else, I think it makes sense to follow-up with urology.  This is not an acute issue so it does not have to be an urgent evaluation but I would like urology input.  Reasonable to check basic labs along with L-spine series.  It could be that he has mechanical symptoms in his back with nerve compression giving him referred pain in his groin.  Because of the discomfort on his right testicle I could not comfortably perform a thorough hernia exam on the patient.  He understood this.  We need to recheck his creatinine given his previous level and we agreed to check his lipids concurrently since we were drawing blood. See notes on labs.  NSAID cautions discussed with patient.  He can try gabapentin to see if that helps with the pain.  Routine cautions given to patient.  Sedation caution.

## 2021-10-29 NOTE — Assessment & Plan Note (Addendum)
Per patient report, his previous psychiatry MD is going to retire.  I think it makes sense to continue/establish care with psychiatry regarding his refills in the future.  Discussed.  He can check on follow-up options.

## 2021-10-29 NOTE — Assessment & Plan Note (Signed)
See back pain discussion.

## 2021-10-29 NOTE — Assessment & Plan Note (Signed)
See notes on labs. 
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
## 2021-11-14 ENCOUNTER — Encounter: Payer: Self-pay | Admitting: Urology

## 2021-11-14 ENCOUNTER — Ambulatory Visit (INDEPENDENT_AMBULATORY_CARE_PROVIDER_SITE_OTHER): Payer: Medicare Other | Admitting: Urology

## 2021-11-14 VITALS — BP 146/85 | HR 73 | Wt 164.0 lb

## 2021-11-14 DIAGNOSIS — N200 Calculus of kidney: Secondary | ICD-10-CM | POA: Diagnosis not present

## 2021-11-14 LAB — URINALYSIS, ROUTINE W REFLEX MICROSCOPIC
Bilirubin, UA: NEGATIVE
Glucose, UA: NEGATIVE
Leukocytes,UA: NEGATIVE
Nitrite, UA: NEGATIVE
Specific Gravity, UA: >1.03 — ABNORMAL HIGH (ref 1.005–1.030)
Urobilinogen, Ur: 0.2 mg/dL (ref 0.2–1.0)
pH, UA: 5.5 (ref 5.0–7.5)

## 2021-11-14 LAB — MICROSCOPIC EXAMINATION
Epithelial Cells (non renal): NONE SEEN /hpf (ref 0–10)
RBC, Urine: >30 /hpf — AB (ref 0–2)
Renal Epithel, UA: NONE SEEN /hpf
WBC, UA: NONE SEEN /hpf (ref 0–5)

## 2021-11-16 ENCOUNTER — Other Ambulatory Visit: Payer: Self-pay | Admitting: Cardiovascular Disease

## 2021-11-27 ENCOUNTER — Encounter: Payer: Self-pay | Admitting: Gastroenterology

## 2021-12-05 ENCOUNTER — Other Ambulatory Visit: Payer: Self-pay | Admitting: Cardiovascular Disease

## 2022-01-09 ENCOUNTER — Telehealth: Payer: Self-pay | Admitting: Family Medicine

## 2022-01-09 NOTE — Telephone Encounter (Signed)
Patient called in wanting to know if Dr. Damita Dunnings could recommend some psychiatrist in the [place] area. Please advise. Thank you!

## 2022-01-10 NOTE — Telephone Encounter (Signed)
Called and discussed with patient and he is going to call Dr. Starleen Arms office and a couple of others to see who he can get in with.

## 2022-01-10 NOTE — Telephone Encounter (Addendum)
Suggest Dr. Toy Care.  PHONE [phone] [address] [place], [place] [address]
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
## 2022-01-25 ENCOUNTER — Ambulatory Visit (INDEPENDENT_AMBULATORY_CARE_PROVIDER_SITE_OTHER): Payer: Medicare Other | Admitting: Cardiovascular Disease

## 2022-01-25 ENCOUNTER — Encounter: Payer: Self-pay | Admitting: Cardiovascular Disease

## 2022-01-25 VITALS — BP 150/92 | HR 83 | Ht 65.0 in | Wt 156.2 lb

## 2022-01-25 DIAGNOSIS — I2510 Atherosclerotic heart disease of native coronary artery without angina pectoris: Secondary | ICD-10-CM | POA: Diagnosis not present

## 2022-01-25 DIAGNOSIS — F319 Bipolar disorder, unspecified: Secondary | ICD-10-CM

## 2022-01-25 DIAGNOSIS — I447 Left bundle-branch block, unspecified: Secondary | ICD-10-CM | POA: Diagnosis not present

## 2022-01-25 DIAGNOSIS — Z720 Tobacco use: Secondary | ICD-10-CM | POA: Diagnosis not present

## 2022-01-25 NOTE — Patient Instructions (Signed)
Medication Instructions:  Your physician recommends that you continue on your current medications as directed. Please refer to the Current Medication list given to you today.  *If you need a refill on your cardiac medications before your next appointment, please call your pharmacy*   Lab Work: NONE   If you have labs (blood work) drawn today and your tests are completely normal, you will receive your results only by: [place] (if you have MyChart) OR A paper copy in the mail If you have any lab test that is abnormal or we need to change your treatment, we will call you to review the results.   Testing/Procedures: NONE    Follow-Up: At [facility], you and your health needs are our priority.  As part of our continuing mission to provide you with exceptional heart care, we have created designated Provider Care Teams.  These Care Teams include your primary Cardiologist (physician) and Advanced Practice Providers (APPs -  Physician Assistants and Nurse Practitioners) who all work together to provide you with the care you need, when you need it.  We recommend signing up for the patient portal called "MyChart".  Sign up information is provided on this After Visit Summary.  MyChart is used to connect with patients for Virtual Visits (Telemedicine).  Patients are able to view lab/test results, encounter notes, upcoming appointments, etc.  Non-urgent messages can be sent to your provider as well.   To learn more about what you can do with MyChart, go to [email or web address].    Your next appointment:   6 month(s)  The format for your next appointment:   In Person  Provider:   Jenkins Rouge, MD    Other Instructions Thank you for choosing [facility]!    Important Information About Sugar

## 2022-02-20 ENCOUNTER — Encounter (HOSPITAL_COMMUNITY): Payer: Self-pay

## 2022-02-20 ENCOUNTER — Encounter (HOSPITAL_COMMUNITY)
Admission: RE | Admit: 2022-02-20 | Discharge: 2022-02-20 | Disposition: A | Discharge status: Home / Self Care | Payer: Medicare Other | Source: Ambulatory Visit | Attending: Ophthalmology | Admitting: Ophthalmology

## 2022-02-20 HISTORY — DX: Essential (primary) hypertension: I10

## 2022-02-20 NOTE — H&P (Signed)
Surgical History & Physical  Patient Name: Joseph Vaughn DOB: 1956/03/06  Surgery: Cataract extraction with intraocular lens implant phacoemulsification; Left Eye  Surgeon: Baruch Goldmann MD Surgery Date:  02-23-22 Pre-Op Date:  02-08-22  HPI: A 6 Yr. old male patient 1. 1. The patient is here for a cataract evaluation of both eyes, referred from Dr. Jorja Loa. The patient's vision is blurry for a year now.. The complaint is associated with difficulty driving at night This is negatively affecting the patient's quality of life and the patient is unable to function adequately in life with the current level of vision. Left eye is much worse than the right eye. HPI was performed by Baruch Goldmann .  Medical History: Cataracts Optic Nerve drusen Depressive disorder( bipolar) Heart Problem High Blood Pressure LDL  Review of Systems Negative Allergic/Immunologic Negative Cardiovascular Negative Constitutional Negative Ear, Nose, Mouth & Throat Negative Endocrine Negative Eyes Negative Gastrointestinal Negative Genitourinary Negative Hemotologic/Lymphatic Negative Integumentary Negative Musculoskeletal Negative Neurological Negative Psychiatry Negative Respiratory  Social   Current every day smoker   Medication  Lamictal, Wellbutrin, Aspirin, Metoprolol, Crestor, Brelenta, Pantoprazole, Bupropion HCL,   Sx/Procedures  Heart Stents, Knee Surgery left, Lower back X2, Neck surgery X2, Hand Surgery,   Drug Allergies   NKDA  History & Physical: Heent: Cataract, left eye NECK: supple without bruits LUNGS: lungs clear to auscultation CV: regular rate and rhythm Abdomen: soft and non-tender Impression & Plan: Assessment: 1.  NUCLEAR SCLEROSIS AGE RELATED; Both Eyes (H25.13) 2.  BLEPHARITIS; Right Upper Lid, Right Lower Lid, Left Upper Lid, Left Lower Lid (H01.001, H01.002,H01.004,H01.005) 3.  DERMATOCHALASIS, no surgery; Right Upper Lid, Left Upper Lid (H02.831, H02.834) 4.   Pinguecula; Both Eyes (H11.153) 5.  ASTIGMATISM, REGULAR; Both Eyes (H52.223)  Plan: 1.  Cataract accounts for the patient's decreased vision. This visual impairment is not correctable with a tolerable change in glasses or contact lenses. Cataract surgery with an implantation of a new lens should significantly improve the visual and functional status of the patient. Discussed all risks, benefits, alternatives, and potential complications. Discussed the procedures and recovery. Patient desires to have surgery. A-scan ordered and performed today for intra-ocular lens calculations. The surgery will be performed in order to improve vision for driving, reading, and for eye examinations. Recommend phacoemulsification with intra-ocular lens. Recommend Dextenza for post-operative pain and inflammation. Left Eye. Dilates poorly - shugarcaine by protocol. Malyugin Ring. Omidira. Toric Lens OS only - eyhance vs. vivity Toric..  2.  Recommend regular lid cleaning.  3.  Asymptomatic, recommend observation for now. Findings, prognosis and treatment options reviewed.  4.  Observe; Artificial tears as needed for irritation.  5.  worse OS. Recommend toric IOL OS.

## 2022-02-23 ENCOUNTER — Ambulatory Visit (HOSPITAL_BASED_OUTPATIENT_CLINIC_OR_DEPARTMENT_OTHER): Payer: Medicare Other | Admitting: Anesthesiology

## 2022-02-23 ENCOUNTER — Ambulatory Visit (HOSPITAL_COMMUNITY): Payer: Medicare Other | Admitting: Anesthesiology

## 2022-02-23 ENCOUNTER — Ambulatory Visit (HOSPITAL_COMMUNITY)
Admission: RE | Admit: 2022-02-23 | Discharge: 2022-02-23 | Disposition: A | Discharge status: Home / Self Care | Payer: Medicare Other | Attending: Ophthalmology | Admitting: Ophthalmology

## 2022-02-23 ENCOUNTER — Encounter (HOSPITAL_COMMUNITY)
Admission: RE | Disposition: A | Discharge status: Home / Self Care | Payer: Self-pay | Source: Home / Self Care | Attending: Ophthalmology

## 2022-02-23 DIAGNOSIS — K219 Gastro-esophageal reflux disease without esophagitis: Secondary | ICD-10-CM | POA: Diagnosis not present

## 2022-02-23 DIAGNOSIS — I129 Hypertensive chronic kidney disease with stage 1 through stage 4 chronic kidney disease, or unspecified chronic kidney disease: Secondary | ICD-10-CM | POA: Insufficient documentation

## 2022-02-23 DIAGNOSIS — I252 Old myocardial infarction: Secondary | ICD-10-CM | POA: Diagnosis not present

## 2022-02-23 DIAGNOSIS — F319 Bipolar disorder, unspecified: Secondary | ICD-10-CM | POA: Diagnosis not present

## 2022-02-23 DIAGNOSIS — H52222 Regular astigmatism, left eye: Secondary | ICD-10-CM

## 2022-02-23 DIAGNOSIS — H25812 Combined forms of age-related cataract, left eye: Secondary | ICD-10-CM | POA: Insufficient documentation

## 2022-02-23 DIAGNOSIS — N183 Chronic kidney disease, stage 3 unspecified: Secondary | ICD-10-CM | POA: Diagnosis not present

## 2022-02-23 DIAGNOSIS — N182 Chronic kidney disease, stage 2 (mild): Secondary | ICD-10-CM | POA: Insufficient documentation

## 2022-02-23 DIAGNOSIS — I2510 Atherosclerotic heart disease of native coronary artery without angina pectoris: Secondary | ICD-10-CM

## 2022-02-23 DIAGNOSIS — H2181 Floppy iris syndrome: Secondary | ICD-10-CM | POA: Insufficient documentation

## 2022-02-23 DIAGNOSIS — F17200 Nicotine dependence, unspecified, uncomplicated: Secondary | ICD-10-CM | POA: Diagnosis not present

## 2022-02-23 DIAGNOSIS — I447 Left bundle-branch block, unspecified: Secondary | ICD-10-CM | POA: Diagnosis not present

## 2022-02-23 DIAGNOSIS — H2512 Age-related nuclear cataract, left eye: Secondary | ICD-10-CM

## 2022-02-23 DIAGNOSIS — Z955 Presence of coronary angioplasty implant and graft: Secondary | ICD-10-CM | POA: Diagnosis not present

## 2022-02-23 DIAGNOSIS — Z79899 Other long term (current) drug therapy: Secondary | ICD-10-CM | POA: Insufficient documentation

## 2022-02-23 HISTORY — PX: CATARACT EXTRACTION W/PHACO: SHX586

## 2022-02-23 SURGERY — PHACOEMULSIFICATION, CATARACT, WITH IOL INSERTION
Anesthesia: Monitor Anesthesia Care | Site: Eye | Laterality: Left

## 2022-02-23 MED ORDER — PHENYLEPHRINE-KETOROLAC 1-0.3 % IO SOLN
INTRAOCULAR | Status: DC | PRN
  Administered 2022-02-23: 500 mL via OPHTHALMIC

## 2022-02-23 MED ORDER — BSS IO SOLN
INTRAOCULAR | Status: DC | PRN
  Administered 2022-02-23: 15 mL via INTRAOCULAR

## 2022-02-23 MED ORDER — PHENYLEPHRINE-KETOROLAC 1-0.3 % IO SOLN
INTRAOCULAR | Status: AC
  Filled 2022-02-23: qty 4

## 2022-02-23 MED ORDER — TETRACAINE HCL 0.5 % OP SOLN
1.0000 [drp] | OPHTHALMIC | Status: DC | PRN
  Administered 2022-02-23 (×2): 1 [drp] via OPHTHALMIC

## 2022-02-23 MED ORDER — SODIUM HYALURONATE 10 MG/ML IO SOLUTION
PREFILLED_SYRINGE | INTRAOCULAR | Status: DC | PRN
  Administered 2022-02-23: 0.85 mL via INTRAOCULAR

## 2022-02-23 MED ORDER — POVIDONE-IODINE 5 % OP SOLN
OPHTHALMIC | Status: DC | PRN
  Administered 2022-02-23: 1 via OPHTHALMIC

## 2022-02-23 MED ORDER — LIDOCAINE HCL (PF) 1 % IJ SOLN
INTRAOCULAR | Status: DC | PRN
  Administered 2022-02-23: 1 mL via OPHTHALMIC

## 2022-02-23 MED ORDER — PHENYLEPHRINE HCL 2.5 % OP SOLN
1.0000 [drp] | OPHTHALMIC | Status: DC | PRN
  Administered 2022-02-23 (×2): 1 [drp] via OPHTHALMIC

## 2022-02-23 MED ORDER — STERILE WATER FOR IRRIGATION IR SOLN
Status: DC | PRN
  Administered 2022-02-23: 500 mL

## 2022-02-23 MED ORDER — MOXIFLOXACIN HCL 0.5 % OP SOLN
OPHTHALMIC | Status: DC | PRN
  Administered 2022-02-23: 2 [drp] via OPHTHALMIC

## 2022-02-23 MED ORDER — MIDAZOLAM HCL 5 MG/5ML IJ SOLN
INTRAMUSCULAR | Status: DC | PRN
  Administered 2022-02-23: 2 mg via INTRAVENOUS

## 2022-02-23 MED ORDER — TROPICAMIDE 1 % OP SOLN
1.0000 [drp] | OPHTHALMIC | Status: DC | PRN
  Administered 2022-02-23 (×2): 1 [drp] via OPHTHALMIC

## 2022-02-23 MED ORDER — SODIUM HYALURONATE 23MG/ML IO SOSY
PREFILLED_SYRINGE | INTRAOCULAR | Status: DC | PRN
  Administered 2022-02-23: 0.6 mL via INTRAOCULAR

## 2022-02-23 MED ORDER — EPINEPHRINE PF 1 MG/ML IJ SOLN
INTRAMUSCULAR | Status: AC
  Filled 2022-02-23: qty 1

## 2022-02-23 MED ORDER — LIDOCAINE HCL 3.5 % OP GEL
1.0000 | Freq: Once | OPHTHALMIC | Status: AC
  Administered 2022-02-23: 1 via OPHTHALMIC

## 2022-02-23 MED ORDER — MIDAZOLAM HCL 2 MG/2ML IJ SOLN
INTRAMUSCULAR | Status: AC
  Filled 2022-02-23: qty 2

## 2022-02-23 SURGICAL SUPPLY — 17 items
CATARACT SUITE SIGHTPATH (MISCELLANEOUS) ×1 IMPLANT
CLOTH BEACON ORANGE TIMEOUT ST (SAFETY) ×1 IMPLANT
EYE SHIELD UNIVERSAL CLEAR (GAUZE/BANDAGES/DRESSINGS) IMPLANT
FEE CATARACT SUITE SIGHTPATH (MISCELLANEOUS) ×1 IMPLANT
GLOVE BIOGEL PI IND STRL 7.0 (GLOVE) ×2 IMPLANT
GLOVE SS BIOGEL STRL SZ 6.5 (GLOVE) IMPLANT
GLOVE SURG SS PI 7.5 STRL IVOR (GLOVE) IMPLANT
LENS IOL EYHANCE TRC 150 20.0 IMPLANT
LENS IOL TORIC DIU150 20.0 ×1 IMPLANT
NDL HYPO 18GX1.5 BLUNT FILL (NEEDLE) ×1 IMPLANT
NEEDLE HYPO 18GX1.5 BLUNT FILL (NEEDLE) ×1 IMPLANT
PAD ARMBOARD 7.5X6 YLW CONV (MISCELLANEOUS) ×1 IMPLANT
RING MALYGIN 7.0 (MISCELLANEOUS) IMPLANT
SYR TB 1ML LL NO SAFETY (SYRINGE) ×1 IMPLANT
TAPE SURG TRANSPORE 1 IN (GAUZE/BANDAGES/DRESSINGS) IMPLANT
TAPE SURGICAL TRANSPORE 1 IN (GAUZE/BANDAGES/DRESSINGS) ×1
WATER STERILE IRR 250ML POUR (IV SOLUTION) ×1 IMPLANT

## 2022-02-23 NOTE — Transfer of Care (Signed)
Immediate Anesthesia Transfer of Care Note  Patient: Joseph Vaughn  Procedure(s) Performed: CATARACT EXTRACTION PHACO AND INTRAOCULAR LENS PLACEMENT (IOC) (Left: Eye)  Patient Location: PACU  Anesthesia Type:MAC  Level of Consciousness: awake, alert  and oriented  Airway & Oxygen Therapy: Patient Spontanous Breathing  Post-op Assessment: Report given to RN, Post -op Vital signs reviewed and stable and Patient moving all extremities X 4  Post vital signs: Reviewed and stable  Last Vitals:  Vitals Value Taken Time  BP    Temp    Pulse    Resp    SpO2      Last Pain:  Vitals:   02/23/22 1012  TempSrc: Oral  PainSc: 0-No pain      See RN flow record  Patients Stated Pain Goal: 5 (32/12/24 8250)  Complications: No notable events documented.

## 2022-02-23 NOTE — Discharge Instructions (Signed)
Please discharge patient when stable, will follow up today with Dr. Sujata Maines at the [facility] [place] office immediately following discharge.  Leave shield in place until visit.  All paperwork with discharge instructions will be given at the office.  [facility] Address:  [address]  [place], [place] [address]  

## 2022-02-23 NOTE — Anesthesia Postprocedure Evaluation (Signed)
Anesthesia Post Note  Patient: DRAYKE GRABEL  Procedure(s) Performed: CATARACT EXTRACTION PHACO AND INTRAOCULAR LENS PLACEMENT (IOC) (Left: Eye)  Patient location during evaluation: Phase II Anesthesia Type: MAC Level of consciousness: awake and alert and oriented Pain management: pain level controlled Vital Signs Assessment: post-procedure vital signs reviewed and stable Respiratory status: spontaneous breathing, nonlabored ventilation and respiratory function stable Cardiovascular status: stable and blood pressure returned to baseline Postop Assessment: no apparent nausea or vomiting Anesthetic complications: no   No notable events documented.   Last Vitals:  Vitals:   02/23/22 1012 02/23/22 1101  BP: (!) 141/56 136/83  Pulse: 62 62  Resp: 18 18  Temp: 36.7 C (!) 36.1 C  SpO2: 98% 98%    Last Pain:  Vitals:   02/23/22 1101  TempSrc: Oral  PainSc: 0-No pain                 Karesa Maultsby C Yvanna Vidas

## 2022-02-23 NOTE — Interval H&P Note (Signed)
History and Physical Interval Note:  02/23/2022 10:33 AM  Joseph Vaughn  has presented today for surgery, with the diagnosis of nuclear sclerosis age related cataract; left.  The various methods of treatment have been discussed with the patient and family. After consideration of risks, benefits and other options for treatment, the patient has consented to  Procedure(s) with comments: CATARACT EXTRACTION PHACO AND INTRAOCULAR LENS PLACEMENT (IOC) (Left) - CDE:  as a surgical intervention.  The patient's history has been reviewed, patient examined, no change in status, stable for surgery.  I have reviewed the patient's chart and labs.  Questions were answered to the patient's satisfaction.     Baruch Goldmann
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
## 2022-02-23 NOTE — Op Note (Signed)
Date of procedure: 02/23/22  Pre-operative diagnosis:  Visually significant age-related combined cataract, Left Eye (H25.812)  Regular astigmatism, left eye Poor dilation, Left eye   Post-operative diagnosis: Visually significant age-related cataract, Left Eye; Intra-operative Floppy Iris Syndrome, Left Eye (H21.81)  Procedure: Complex removal of cataract via phacoemulsification and insertion of intra-ocular lens [employer] [id number] +20.0D into the capsular bag of the Left Eye (CPT [phone])  Attending surgeon: Gerda Diss. Bertrum Helmstetter, MD, MA  Anesthesia: MAC, Topical Akten  Complications: None  Estimated Blood Loss: <10m (minimal)  Specimens: None  Implants: As above  Indications:  Visually significant cataract, Left Eye  Procedure:  The patient was seen and identified in the pre-operative area. The operative eye was identified and dilated.  The operative eye was marked.  Topical anesthesia was administered to the operative eye.  The eye was marked at 0, 180, and 270 degrees.  The patient was then to the operative suite and placed in the supine position.  A timeout was performed confirming the patient, procedure to be performed, and all other relevant information.   The patient's face was prepped and draped in the usual fashion for intra-ocular surgery.  A lid speculum was placed into the operative eye and the surgical microscope moved into place and focused.  Poor dilation of the iris was confirmed.  An inferotemporal paracentesis was created using a 20 gauge paracentesis blade.  Shugarcaine was injected into the anterior chamber.  Viscoelastic was injected into the anterior chamber.  A temporal clear-corneal main wound incision was created using a 2.498mmicrokeratome.  A Malyugin ring was placed.  A continuous curvilinear capsulorrhexis was initiated using an irrigating cystitome and completed using capsulorrhexis forceps.  Hydrodissection and hydrodeliniation were performed.   Viscoelastic was injected into the anterior chamber.  A phacoemulsification handpiece and a chopper as a second instrument were used to remove the nucleus and epinucleus. The irrigation/aspiration handpiece was used to remove any remaining cortical material.   The capsular bag was reinflated with viscoelastic, checked, and found to be intact. The eye was marked at 170 degrees.  The intraocular lens was inserted into the capsular bag and dialed into place using a Malyugin Ring Manipulator at 170 degrees.. The Malyugin ring was removed.  The irrigation/aspiration handpiece was used to remove any remaining viscoelastic.  The clear corneal wound and paracentesis wounds were then hydrated and checked with Weck-Cels to be watertight.  The lid-speculum and drape was removed, and the patient's face was cleaned with a wet and dry 4x4.  Moxifloxacin drops were instilled on the eye. A clear shield was taped over the eye. The patient was taken to the post-operative care unit in good condition, having tolerated the procedure well.  Post-Op Instructions: [facility] and will receive all other orders and instructions.

## 2022-02-28 ENCOUNTER — Encounter (HOSPITAL_COMMUNITY): Payer: Self-pay | Admitting: Ophthalmology

## 2022-03-06 IMAGING — CT CT ABD-PELV W/ CM
2 of 5 series · 16 of 46 positions shown, 18 images · IV contrast (Omnipaque or Isovue)
Comparison: 07/21/2019

CLINICAL DATA: Right lower quadrant abdominal pain

EXAM:
CT ABDOMEN AND PELVIS WITH CONTRAST
TECHNIQUE: Multidetector CT imaging of the abdomen and pelvis was performed
using the standard protocol following bolus administration of
intravenous contrast.
CONTRAST:  100mL OMNIPAQUE IOHEXOL 300 MG/ML  SOLN

[Series 1: axial st · axial · 0.82mm/px · z∈[+862,+1372]mm · 13 of 114 slices shown, 15 images]
[im 6/114  soft-tissue]
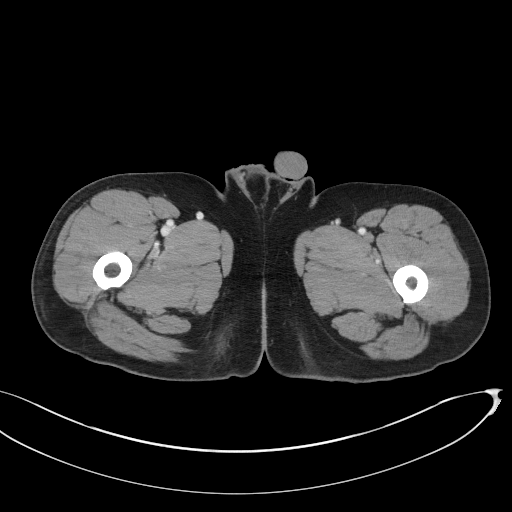
[im 6/114  bone]
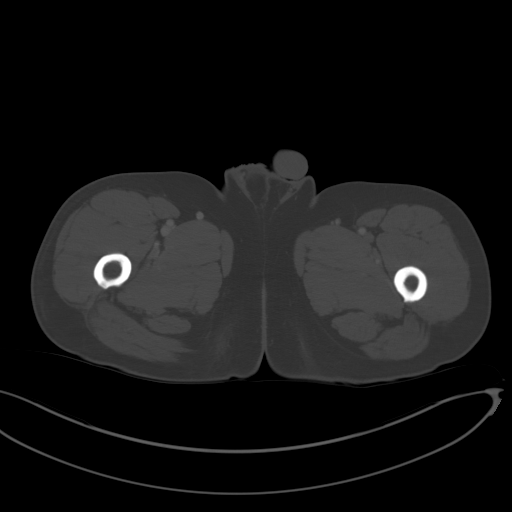
[im 17/114  soft-tissue]
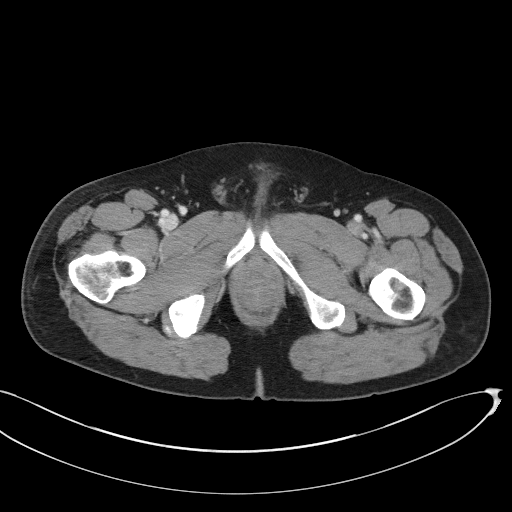
[im 23/114  soft-tissue]
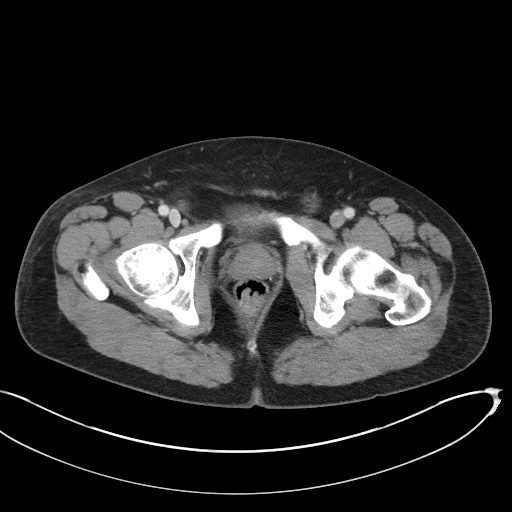
[im 34/114  soft-tissue]
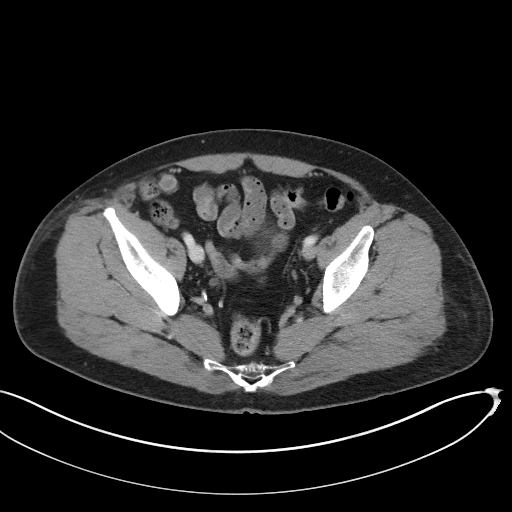
[im 40/114  soft-tissue]
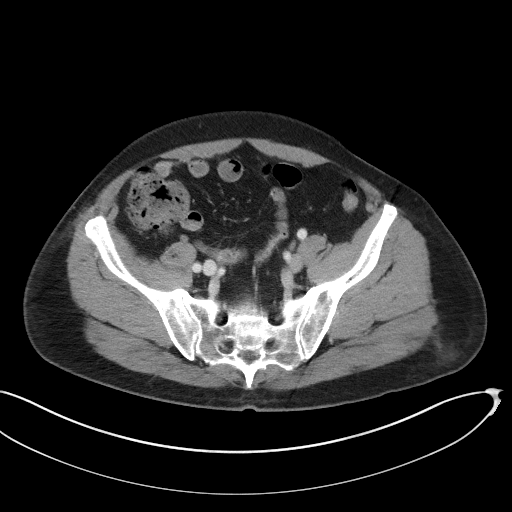
[im 51/114  soft-tissue]
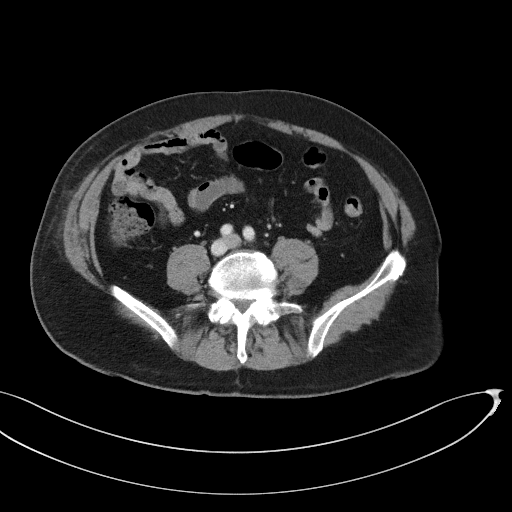
[im 57/114  soft-tissue]
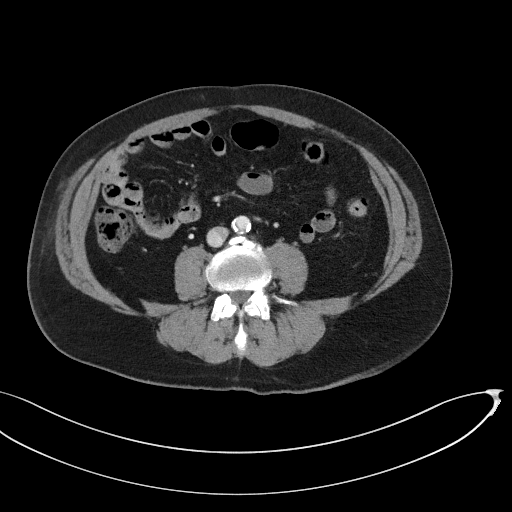
[im 63/114  soft-tissue]
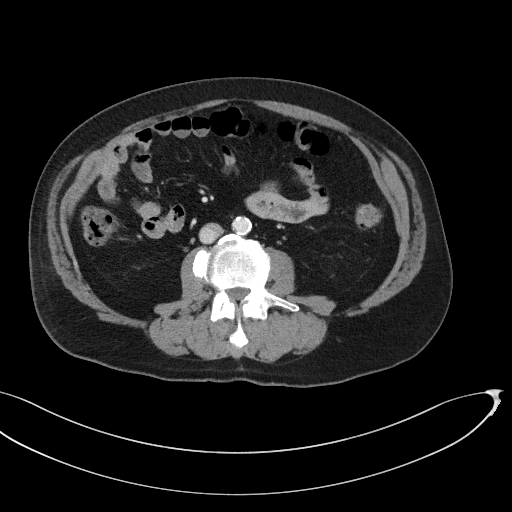
[im 74/114  soft-tissue]
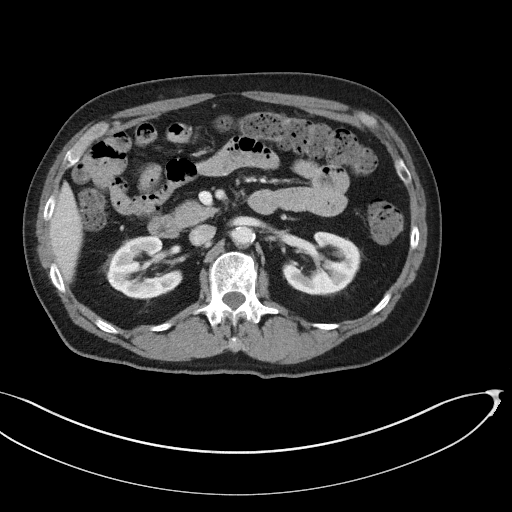
[im 74/114  bone]
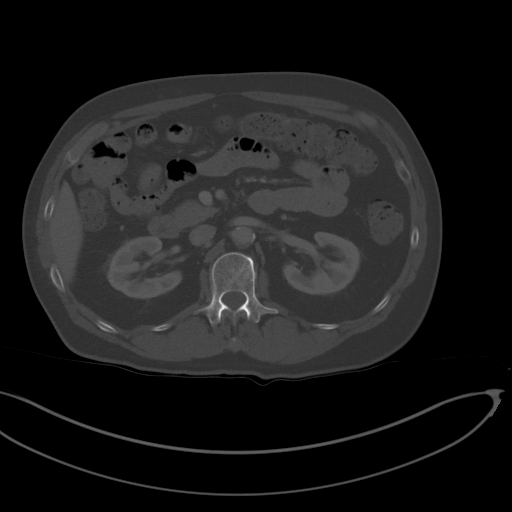
[im 80/114  soft-tissue]
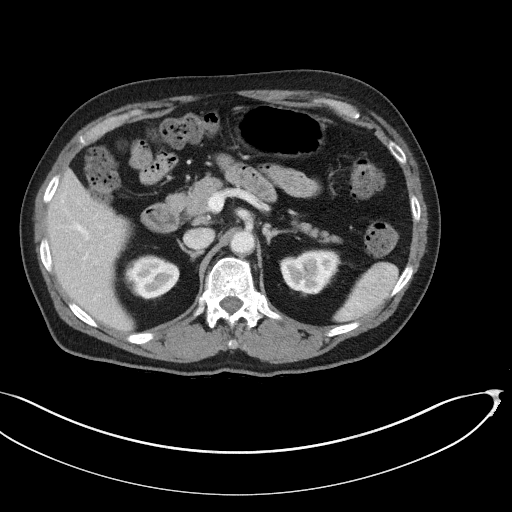
[im 91/114  soft-tissue]
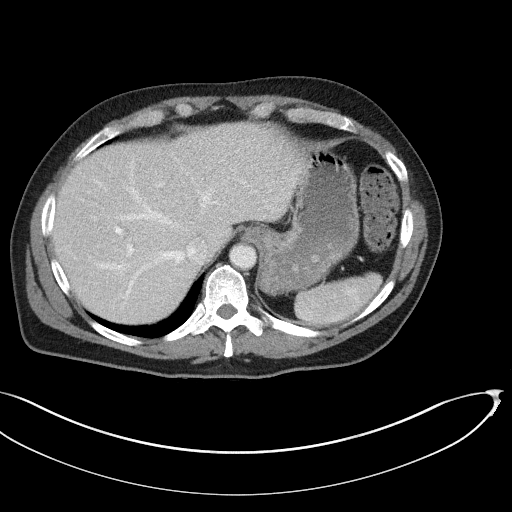
[im 97/114  soft-tissue]
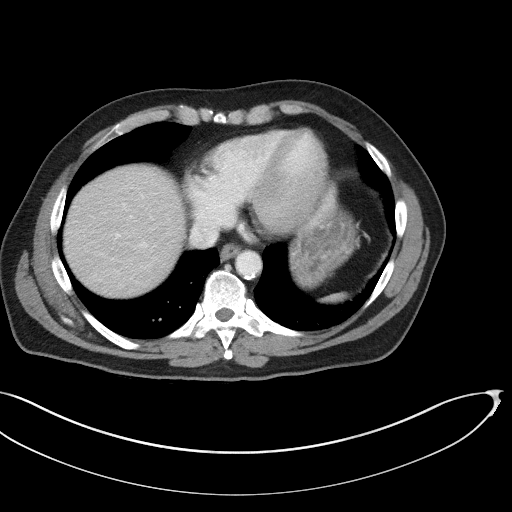
[im 108/114  soft-tissue]
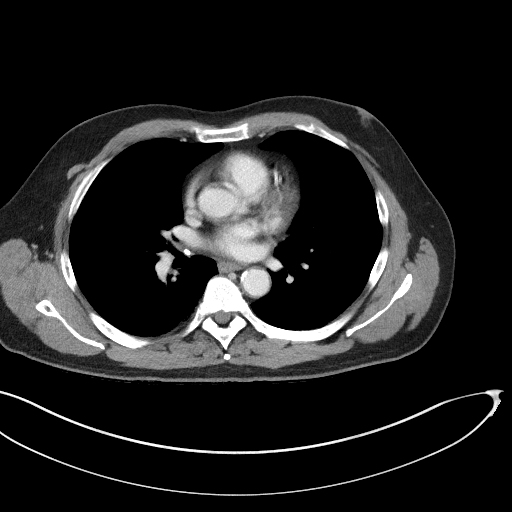

[Series 5: coronal st · coronal · 0.78mm/px · 3 of 105 slices shown]
[im 35/105  soft-tissue]
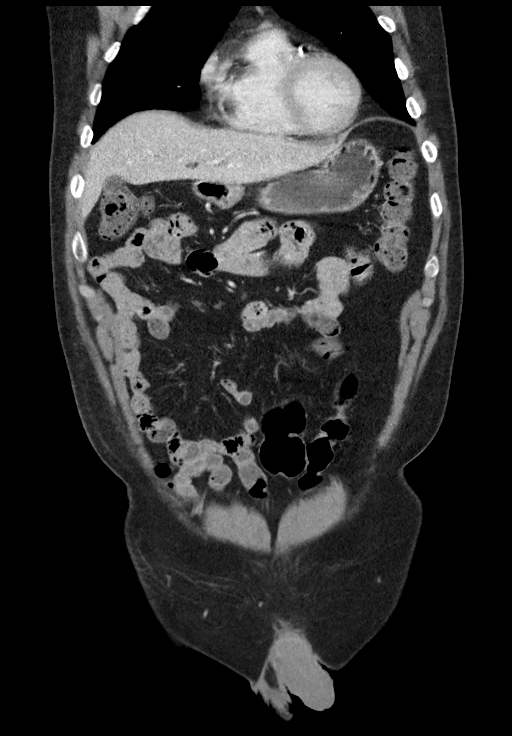
[im 47/105  soft-tissue]
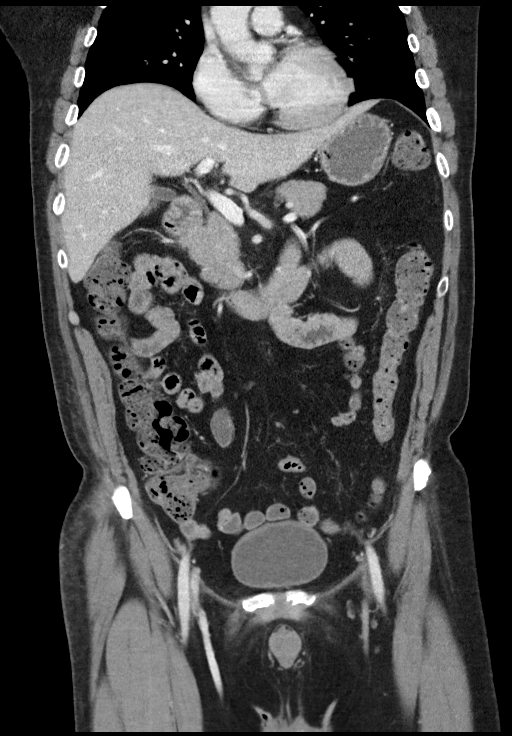
[im 58/105  soft-tissue]
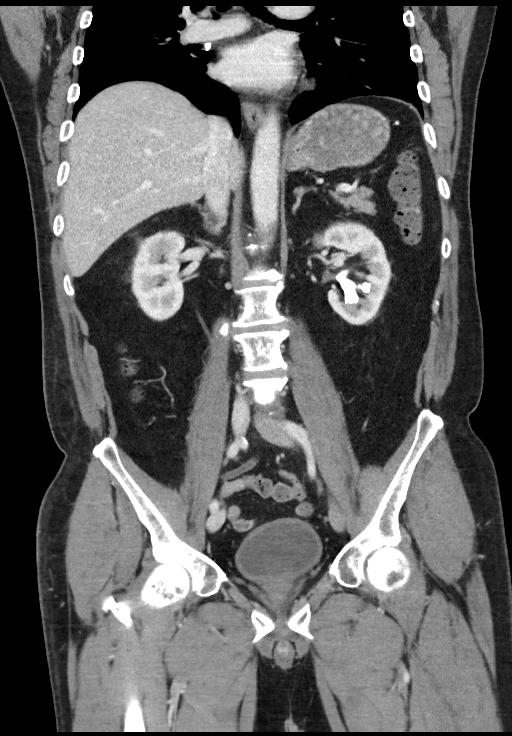

[16 of 46 positions shown; findings below may reference images not displayed]

FINDINGS: LOWER CHEST: Normal.

HEPATOBILIARY: Normal hepatic contours. No intra- or extrahepatic
biliary dilatation. The gallbladder is normal.

PANCREAS: Normal pancreas. No ductal dilatation or peripancreatic
fluid collection.

SPLEEN: Normal.

ADRENALS/URINARY TRACT: The adrenal glands are normal. Staghorn
calculus within the left renal pelvis measuring approximately 22 mm.
The urinary bladder is normal for degree of distention

STOMACH/BOWEL: There is no hiatal hernia. Normal duodenal course and
caliber. No small bowel dilatation or inflammation. No focal colonic
abnormality. Normal appendix.

VASCULAR/LYMPHATIC: Normal course and caliber of the major abdominal
vessels. No abdominal or pelvic lymphadenopathy.

REPRODUCTIVE: Normal prostate size with symmetric seminal vesicles.

MUSCULOSKELETAL. No bony spinal canal stenosis or focal osseous
abnormality.

OTHER: None.
IMPRESSION: 1. No acute abnormality of the abdomen or pelvis.
2. Staghorn calculus within the left renal pelvis measuring
approximately 22 mm, unchanged.
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
## 2022-03-26 ENCOUNTER — Other Ambulatory Visit (HOSPITAL_COMMUNITY): Payer: Medicare Other

## 2022-05-15 ENCOUNTER — Encounter: Payer: Self-pay | Admitting: Urology

## 2022-05-15 ENCOUNTER — Ambulatory Visit (INDEPENDENT_AMBULATORY_CARE_PROVIDER_SITE_OTHER): Payer: Medicare Other | Admitting: Urology

## 2022-05-15 VITALS — BP 132/81 | HR 69

## 2022-05-15 DIAGNOSIS — N200 Calculus of kidney: Secondary | ICD-10-CM | POA: Diagnosis not present

## 2022-05-15 DIAGNOSIS — I2510 Atherosclerotic heart disease of native coronary artery without angina pectoris: Secondary | ICD-10-CM | POA: Diagnosis not present
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
## 2022-05-17 ENCOUNTER — Other Ambulatory Visit: Payer: Self-pay | Admitting: Cardiovascular Disease

## 2022-05-17 ENCOUNTER — Ambulatory Visit (HOSPITAL_COMMUNITY)
Admission: RE | Admit: 2022-05-17 | Discharge: 2022-05-17 | Disposition: A | Discharge status: Home / Self Care | Payer: Medicare Other | Source: Ambulatory Visit | Attending: Urology | Admitting: Urology

## 2022-05-17 DIAGNOSIS — N200 Calculus of kidney: Secondary | ICD-10-CM | POA: Diagnosis present

## 2022-05-18 ENCOUNTER — Encounter: Payer: Self-pay | Admitting: Urology

## 2022-05-18 ENCOUNTER — Ambulatory Visit (INDEPENDENT_AMBULATORY_CARE_PROVIDER_SITE_OTHER): Payer: Medicare Other | Admitting: Urology

## 2022-05-18 VITALS — BP 126/73 | HR 67

## 2022-05-18 DIAGNOSIS — N200 Calculus of kidney: Secondary | ICD-10-CM | POA: Diagnosis not present

## 2022-05-18 DIAGNOSIS — I2510 Atherosclerotic heart disease of native coronary artery without angina pectoris: Secondary | ICD-10-CM | POA: Diagnosis not present

## 2022-05-18 LAB — URINALYSIS, ROUTINE W REFLEX MICROSCOPIC
Bilirubin, UA: NEGATIVE
Glucose, UA: NEGATIVE
Ketones, UA: NEGATIVE
Leukocytes,UA: NEGATIVE
Nitrite, UA: NEGATIVE
Specific Gravity, UA: 1.025 (ref 1.005–1.030)
Urobilinogen, Ur: 0.2 mg/dL (ref 0.2–1.0)
pH, UA: 5 (ref 5.0–7.5)

## 2022-05-18 LAB — MICROSCOPIC EXAMINATION

## 2022-05-18 NOTE — Patient Instructions (Signed)
Percutaneous Nephrolithotomy Percutaneous nephrolithotomy is a procedure to remove kidney stones. Kidney stones are deposits that form inside your kidneys and can cause pain. You may need this procedure if: You have large kidney stones. Kidney stones that are bigger than 2 cm (0.78 inch) wide may require this procedure. Your kidney stones are oddly shaped. Other treatments have not been successful in helping the kidney stones to pass. You have developed an infection due to the kidney stones. Tell a health care provider about: Any allergies you have. All medicines you are taking, including vitamins, herbs, eye drops, creams, and over-the-counter medicines. Any problems you or family members have had with anesthetic medicines. Any blood disorders you have. Any surgeries you have had. Any medical conditions you have. Whether you are pregnant or may be pregnant. Whether you use any tobacco products, including cigarettes, chewing tobacco, or e-cigarettes. What are the risks? Generally, this is a safe procedure. However, problems may occur, including: Infection. Bleeding. This may include blood in your urine. Allergic reactions to medicines. Damage to other structures or organs. Kidney damage. Holes in the kidney. These often heal on their own. Numbness or tingling in the affected area. Inability to remove all the stones. You may need a different procedure to complete treatment. What happens before the procedure? Staying hydrated Follow instructions from your health care provider about hydration, which may include: Up to 2 hours before the procedure - you may continue to drink clear liquids, such as water, clear fruit juice, black coffee, and plain tea.  Eating and drinking restrictions Follow instructions from your health care provider about eating and drinking, which may include: 8 hours before the procedure - stop eating heavy meals or foods, such as meat, fried foods, or fatty foods. 6  hours before the procedure - stop eating light meals or foods, such as toast or cereal. 6 hours before the procedure - stop drinking milk or drinks that contain milk. 2 hours before the procedure - stop drinking clear liquids. Medicines Ask your health care provider about: Changing or stopping your regular medicines. This is especially important if you are taking diabetes medicines or blood thinners. Taking medicines such as aspirin and ibuprofen. These medicines can thin your blood. Do not take these medicines unless your health care provider tells you to take them. Taking over-the-counter medicines, vitamins, herbs, and supplements. Tests You may have tests, including: Blood tests. Urine tests. Tests to check how your heart is working. Imaging studies. These are used to identify: The size and number (stone burden) of the kidney stones. The position of the kidney stones. General instructions Plan to have someone take you home from the hospital or clinic. Plan to have a responsible adult care for you for at least 24 hours after you leave the hospital or clinic. This is important. Ask your health care provider how your surgical site will be marked or identified. Ask your health care provider what steps will be taken to help prevent infection. These may include: Removing hair at the surgery site. Washing skin with a germ-killing soap. Taking antibiotic medicine. What happens during the procedure?  An IV will be inserted into one of your veins. The site of the procedure will be marked. You will be given one or more of the following: A medicine to help you relax (sedative). A medicine to numb the area (local anesthetic). A medicine to make you fall asleep (general anesthetic). A medicine that is injected into your spine to numb the area below   and slightly above the injection site (spinal anesthetic). A medicine that is injected into an area of your body to numb everything below the  injection site (regional anesthetic). A thin tube (urinary catheter) will be put in your bladder to drain urine during and after the procedure. Your surgeon will make a small cut (incision) in your lower back. A tube will be inserted through the incision into your kidney. Each kidney stone will be removed through this tube. Larger stones may need to be broken up with a high-intensity light beam (laser) or other tools. After all of the stones have been removed, your health care provider may put in tubes to drain your bladder. Based on your condition: An internal tube, called a stent, may be put in your ureter. This will help drain urine from your kidney to your bladder. A surgical drain (nephrostomy tube) may be put in your kidney. The tube comes out through the incision in your lower back. This will help to drain urine or any fluid that builds up while your kidney heals. Part of the incision may be closed with stitches (sutures). A bandage (dressing) will be placed over the incision area. The procedure may vary among health care providers and hospitals. What happens after the procedure? Your blood pressure, heart rate, breathing rate, and blood oxygen level will be monitored until you leave the hospital or clinic. You may be given medicine for pain. You will be shown how to do breathing exercises, such as coughing and breathing deeply. These will help to prevent pneumonia. You will be encouraged to walk. Walking helps to prevent blood clots. Your stent and urinary catheter will be removed after 1-2 days if there is only a small amount of blood in your urine. You will be taught how to care for the catheter or nephrostomy tube, if you have them. Do not drive for 24 hours if you were given a sedative during your procedure. Summary Percutaneous nephrolithotomy is a procedure to remove kidney stones. Ask your health care provider about changing or stopping your regular medicines. Before surgery,  follow instructions from your health care provider about eating and drinking. Plan to have someone take you home from the hospital or clinic. This information is not intended to replace advice given to you by your health care provider. Make sure you discuss any questions you have with your health care provider. Document Revised: 01/29/2021 Document Reviewed: 01/30/2021 Elsevier Patient Education  2023 Elsevier Inc.  
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
## 2022-05-18 NOTE — H&P (Signed)
Surgical History & Physical  Patient Name: Joseph Vaughn DOB: [birth year]  Surgery: Cataract extraction with intraocular lens implant phacoemulsification; Right Eye  Surgeon: Baruch Goldmann MD Surgery Date:  05-25-22 Pre-Op Date:  05-10-22  HPI: A 72 Yr. old male patient 1. The patient is returning after cataract post-op. The left eye is affected. Status post cataract post-op, which began 11 weeks ago: Patient has noticed a black area in vision like a crescent moon on the outer part of vision. He has noticed this ever since the sx. He called to c/o this 02/28/2022 but cancelled the appt. He wonders if it is from his eyelid. Notices it more when looking at something at near. He has an appt with Dr. Marcy Salvo in [place] for eyelid surgery. Patient is not taking any eye drops.  Medical History: Cataracts Optic Nerve drusen Depressive disorder( bipolar) Heart Problem High Blood Pressure LDL  Review of Systems Negative Allergic/Immunologic Negative Cardiovascular Negative Constitutional Negative Ear, Nose, Mouth & Throat Negative Endocrine Negative Eyes Negative Gastrointestinal Negative Genitourinary Negative Hemotologic/Lymphatic Negative Integumentary Negative Musculoskeletal Negative Neurological Negative Psychiatry Negative Respiratory  Social   Current every day smoker   Medication Lamictal, Wellbutrin, Aspirin, Metoprolol, Crestor, Brelenta, Pantoprazole, Bupropion HCL,   Sx/Procedures Phaco c IOL OS Toric Lens,  Heart Stents, Knee Surgery left, Lower back X2, Neck surgery X2, Hand Surgery,   Drug Allergies   NKDA  History & Physical: Heent: Cataract, right eye NECK: supple without bruits LUNGS: lungs clear to auscultation CV: regular rate and rhythm Abdomen: soft and non-tender Impression & Plan: Assessment: 1.  CATARACT EXTRACTION STATUS; Left Eye (Z98.42) 2.  NUCLEAR SCLEROSIS AGE RELATED; , Right Eye (H25.11) 3.  DERMATOCHALASIS, no surgery; Right  Upper Lid, Left Upper Lid (H02.831, U88.916)  Plan: 1.  1 month after Phaco PCIOL. Doing very well with improved vision and normal IOP. Stop all post-operative medications. Call with any concerning symptoms.  2.  Cataract accounts for the patient's decreased vision. This visual impairment is not correctable with a tolerable change in glasses or contact lenses. Cataract surgery with an implantation of a new lens should significantly improve the visual and functional status of the patient. Discussed all risks, benefits, alternatives, and potential complications. Discussed the procedures and recovery. Patient desires to have surgery. A-scan ordered and performed today for intra-ocular lens calculations. The surgery will be performed in order to improve vision for driving, reading, and for eye examinations. Recommend phacoemulsification with intra-ocular lens. Recommend Dextenza for post-operative pain and inflammation. Right Eye. Recommend DIB00 to balance vision. Surgery required to correct imbalance of vision. Dilates poorly - shugarcaine by protocol. Malyugin Ring. Omidira.  3.  Symptomatic., will address after cataract surgery.

## 2022-05-22 ENCOUNTER — Encounter (HOSPITAL_COMMUNITY): Payer: Self-pay

## 2022-05-22 ENCOUNTER — Other Ambulatory Visit: Payer: Self-pay

## 2022-05-22 ENCOUNTER — Encounter (HOSPITAL_COMMUNITY)
Admission: RE | Admit: 2022-05-22 | Discharge: 2022-05-22 | Disposition: A | Discharge status: Home / Self Care | Payer: Medicare Other | Source: Ambulatory Visit | Attending: Ophthalmology | Admitting: Ophthalmology

## 2022-05-23 ENCOUNTER — Telehealth: Payer: Self-pay

## 2022-05-23 NOTE — Telephone Encounter (Signed)
Made patient aware that his stone is about the same size as when last imaged. Still recommend that he see Dr. Alyson Ingles to arrange treatment. Appointment apparently scheduled for March. Patient voiced understanding

## 2022-05-23 NOTE — Telephone Encounter (Signed)
-----   Message from Franchot Gallo, MD sent at 05/22/2022 12:20 PM EST ----- Let patient know that his stone is about the same size as when last imaged.  Still recommend that he see Dr. Alyson Ingles to arrange treatment.  Appointment apparently scheduled for March. ----- Message ----- From: Sherrilyn Rist, CMA Sent: 05/18/2022   8:29 AM EST To: Franchot Gallo, MD  Please review

## 2022-05-25 ENCOUNTER — Encounter (HOSPITAL_COMMUNITY): Payer: Self-pay | Admitting: Ophthalmology

## 2022-05-25 ENCOUNTER — Ambulatory Visit (HOSPITAL_COMMUNITY)
Admission: RE | Admit: 2022-05-25 | Discharge: 2022-05-25 | Disposition: A | Discharge status: Home / Self Care | Payer: Medicare Other | Attending: Ophthalmology | Admitting: Ophthalmology

## 2022-05-25 ENCOUNTER — Encounter (HOSPITAL_COMMUNITY)
Admission: RE | Disposition: A | Discharge status: Home / Self Care | Payer: Self-pay | Source: Home / Self Care | Attending: Ophthalmology

## 2022-05-25 ENCOUNTER — Ambulatory Visit (HOSPITAL_COMMUNITY): Payer: Medicare Other | Admitting: Anesthesiology

## 2022-05-25 ENCOUNTER — Ambulatory Visit (HOSPITAL_BASED_OUTPATIENT_CLINIC_OR_DEPARTMENT_OTHER): Payer: Medicare Other | Admitting: Anesthesiology

## 2022-05-25 DIAGNOSIS — H02834 Dermatochalasis of left upper eyelid: Secondary | ICD-10-CM | POA: Insufficient documentation

## 2022-05-25 DIAGNOSIS — Z9842 Cataract extraction status, left eye: Secondary | ICD-10-CM | POA: Diagnosis not present

## 2022-05-25 DIAGNOSIS — H25811 Combined forms of age-related cataract, right eye: Secondary | ICD-10-CM

## 2022-05-25 DIAGNOSIS — H02831 Dermatochalasis of right upper eyelid: Secondary | ICD-10-CM | POA: Diagnosis not present

## 2022-05-25 DIAGNOSIS — I2510 Atherosclerotic heart disease of native coronary artery without angina pectoris: Secondary | ICD-10-CM

## 2022-05-25 DIAGNOSIS — F17200 Nicotine dependence, unspecified, uncomplicated: Secondary | ICD-10-CM | POA: Insufficient documentation

## 2022-05-25 DIAGNOSIS — I10 Essential (primary) hypertension: Secondary | ICD-10-CM | POA: Diagnosis not present

## 2022-05-25 DIAGNOSIS — F319 Bipolar disorder, unspecified: Secondary | ICD-10-CM | POA: Diagnosis not present

## 2022-05-25 DIAGNOSIS — I252 Old myocardial infarction: Secondary | ICD-10-CM | POA: Diagnosis not present

## 2022-05-25 DIAGNOSIS — F17210 Nicotine dependence, cigarettes, uncomplicated: Secondary | ICD-10-CM

## 2022-05-25 DIAGNOSIS — K219 Gastro-esophageal reflux disease without esophagitis: Secondary | ICD-10-CM | POA: Diagnosis not present

## 2022-05-25 DIAGNOSIS — Z79899 Other long term (current) drug therapy: Secondary | ICD-10-CM | POA: Diagnosis not present

## 2022-05-25 DIAGNOSIS — Z955 Presence of coronary angioplasty implant and graft: Secondary | ICD-10-CM | POA: Insufficient documentation

## 2022-05-25 HISTORY — PX: CATARACT EXTRACTION W/PHACO: SHX586

## 2022-05-25 SURGERY — PHACOEMULSIFICATION, CATARACT, WITH IOL INSERTION
Anesthesia: Monitor Anesthesia Care | Site: Eye | Laterality: Right

## 2022-05-25 MED ORDER — SODIUM HYALURONATE 10 MG/ML IO SOLUTION
PREFILLED_SYRINGE | INTRAOCULAR | Status: DC | PRN
  Administered 2022-05-25: .85 mL via INTRAOCULAR

## 2022-05-25 MED ORDER — MIDAZOLAM HCL 2 MG/2ML IJ SOLN
INTRAMUSCULAR | Status: AC
  Filled 2022-05-25: qty 2

## 2022-05-25 MED ORDER — MOXIFLOXACIN HCL 0.5 % OP SOLN
OPHTHALMIC | Status: DC | PRN
  Administered 2022-05-25: .2 mL via OPHTHALMIC

## 2022-05-25 MED ORDER — MIDAZOLAM HCL 5 MG/5ML IJ SOLN
INTRAMUSCULAR | Status: DC | PRN
  Administered 2022-05-25: 2 mg via INTRAVENOUS

## 2022-05-25 MED ORDER — PHENYLEPHRINE-KETOROLAC 1-0.3 % IO SOLN
INTRAOCULAR | Status: DC | PRN
  Administered 2022-05-25: 500 mL via OPHTHALMIC

## 2022-05-25 MED ORDER — STERILE WATER FOR IRRIGATION IR SOLN
Status: DC | PRN
  Administered 2022-05-25: 250 mL

## 2022-05-25 MED ORDER — LIDOCAINE HCL (PF) 1 % IJ SOLN
INTRAOCULAR | Status: DC | PRN
  Administered 2022-05-25: 1 mL via OPHTHALMIC

## 2022-05-25 MED ORDER — PHENYLEPHRINE-KETOROLAC 1-0.3 % IO SOLN
INTRAOCULAR | Status: AC
  Filled 2022-05-25: qty 4

## 2022-05-25 MED ORDER — POVIDONE-IODINE 5 % OP SOLN
OPHTHALMIC | Status: DC | PRN
  Administered 2022-05-25: 1 via OPHTHALMIC

## 2022-05-25 MED ORDER — LIDOCAINE HCL 3.5 % OP GEL
1.0000 | Freq: Once | OPHTHALMIC | Status: AC
  Administered 2022-05-25: 1 via OPHTHALMIC

## 2022-05-25 MED ORDER — PHENYLEPHRINE HCL 2.5 % OP SOLN
1.0000 [drp] | OPHTHALMIC | Status: AC | PRN
  Administered 2022-05-25 (×3): 1 [drp] via OPHTHALMIC

## 2022-05-25 MED ORDER — SODIUM CHLORIDE 0.9% FLUSH
INTRAVENOUS | Status: DC | PRN
  Administered 2022-05-25: 3 mL via INTRAVENOUS

## 2022-05-25 MED ORDER — TROPICAMIDE 1 % OP SOLN
1.0000 [drp] | OPHTHALMIC | Status: AC | PRN
  Administered 2022-05-25 (×3): 1 [drp] via OPHTHALMIC

## 2022-05-25 MED ORDER — TETRACAINE HCL 0.5 % OP SOLN
1.0000 [drp] | OPHTHALMIC | Status: AC | PRN
  Administered 2022-05-25 (×3): 1 [drp] via OPHTHALMIC

## 2022-05-25 MED ORDER — SODIUM HYALURONATE 23MG/ML IO SOSY
PREFILLED_SYRINGE | INTRAOCULAR | Status: DC | PRN
  Administered 2022-05-25: .6 mL via INTRAOCULAR

## 2022-05-25 SURGICAL SUPPLY — 14 items
CATARACT SUITE SIGHTPATH (MISCELLANEOUS) ×1 IMPLANT
CLOTH BEACON ORANGE TIMEOUT ST (SAFETY) ×1 IMPLANT
EYE SHIELD UNIVERSAL CLEAR (GAUZE/BANDAGES/DRESSINGS) IMPLANT
FEE CATARACT SUITE SIGHTPATH (MISCELLANEOUS) ×1 IMPLANT
GLOVE BIOGEL PI IND STRL 7.0 (GLOVE) ×2 IMPLANT
LENS IOL TECNIS EYHANCE 20.5 (Intraocular Lens) IMPLANT
NDL HYPO 18GX1.5 BLUNT FILL (NEEDLE) ×1 IMPLANT
NEEDLE HYPO 18GX1.5 BLUNT FILL (NEEDLE) ×1 IMPLANT
PAD ARMBOARD 7.5X6 YLW CONV (MISCELLANEOUS) ×1 IMPLANT
RING MALYGIN 7.0 (MISCELLANEOUS) IMPLANT
SYR TB 1ML LL NO SAFETY (SYRINGE) ×1 IMPLANT
TAPE SURG TRANSPORE 1 IN (GAUZE/BANDAGES/DRESSINGS) IMPLANT
TAPE SURGICAL TRANSPORE 1 IN (GAUZE/BANDAGES/DRESSINGS) ×1
WATER STERILE IRR 250ML POUR (IV SOLUTION) ×1 IMPLANT

## 2022-05-25 NOTE — Interval H&P Note (Signed)
History and Physical Interval Note:  05/25/2022 8:18 AM  Joseph Vaughn  has presented today for surgery, with the diagnosis of nuclear sclerosis age related cataract; right.  The various methods of treatment have been discussed with the patient and family. After consideration of risks, benefits and other options for treatment, the patient has consented to  Procedure(s) with comments: CATARACT EXTRACTION PHACO AND INTRAOCULAR LENS PLACEMENT (IOC) (Right) - right as a surgical intervention.  The patient's history has been reviewed, patient examined, no change in status, stable for surgery.  I have reviewed the patient's chart and labs.  Questions were answered to the patient's satisfaction.     Baruch Goldmann

## 2022-05-25 NOTE — Discharge Instructions (Addendum)
Please discharge patient when stable, will follow up today with Dr. Wrzosek at the [facility] [place] office immediately following discharge.  Leave shield in place until visit.  All paperwork with discharge instructions will be given at the office.  [facility] Address:  [address]  [place], [place] [address]  

## 2022-05-25 NOTE — Anesthesia Postprocedure Evaluation (Signed)
Anesthesia Post Note  Patient: Joseph Vaughn  Procedure(s) Performed: CATARACT EXTRACTION PHACO AND INTRAOCULAR LENS PLACEMENT (IOC) (Right: Eye)  Patient location during evaluation: Short Stay Anesthesia Type: MAC Level of consciousness: awake and alert Pain management: pain level controlled Vital Signs Assessment: post-procedure vital signs reviewed and stable Respiratory status: spontaneous breathing Cardiovascular status: blood pressure returned to baseline and stable Postop Assessment: no apparent nausea or vomiting Anesthetic complications: no   No notable events documented.   Last Vitals:  Vitals:   05/25/22 0744  BP: (!) 142/82  Pulse: (!) 54  Resp: 16  Temp: 36.8 C  SpO2: 99%    Last Pain:  Vitals:   05/25/22 0744  TempSrc: Oral  PainSc:                  Tressie Stalker

## 2022-05-25 NOTE — Op Note (Signed)
Date of procedure: 05/25/22  Pre-operative diagnosis:  Visually significant combined form age-related cataract, Right Eye (H25.811)  Post-operative diagnosis:  Visually significant combined form age-related cataract, Right Eye (H25.811)  Procedure: Removal of cataract via phacoemulsification and insertion of intra-ocular lens Rayner [id number] +20.5D into the capsular bag of the Right Eye  Attending surgeon: Gerda Diss. Kemp Gomes, MD, MA  Anesthesia: MAC, Topical Akten  Complications: None  Estimated Blood Loss: <7m (minimal)  Specimens: None  Implants: As above  Indications:  Visually significant age-related cataract, Right Eye  Procedure:  The patient was seen and identified in the pre-operative area. The operative eye was identified and dilated.  The operative eye was marked.  Topical anesthesia was administered to the operative eye.     The patient was then to the operative suite and placed in the supine position.  A timeout was performed confirming the patient, procedure to be performed, and all other relevant information.   The patient's face was prepped and draped in the usual fashion for intra-ocular surgery.  A lid speculum was placed into the operative eye and the surgical microscope moved into place and focused.  A superotemporal paracentesis was created using a 20 gauge paracentesis blade.  Shugarcaine was injected into the anterior chamber.  Viscoelastic was injected into the anterior chamber.  A temporal clear-corneal main wound incision was created using a 2.466mmicrokeratome.  A continuous curvilinear capsulorrhexis was initiated using an irrigating cystitome and completed using capsulorrhexis forceps.  Hydrodissection and hydrodeliniation were performed.  Viscoelastic was injected into the anterior chamber.  A phacoemulsification handpiece and a chopper as a second instrument were used to remove the nucleus and epinucleus. The irrigation/aspiration handpiece was used to remove any  remaining cortical material.   The capsular bag was reinflated with viscoelastic, checked, and found to be intact.  The intraocular lens was inserted into the capsular bag.  The irrigation/aspiration handpiece was used to remove any remaining viscoelastic.  The clear corneal wound and paracentesis wounds were then hydrated and checked with Weck-Cels to be watertight. 0.4m16mf moxifloxacin was injected into the anterior chamber. The lid-speculum was removed.  The drape was removed.  The patient's face was cleaned with a wet and dry 4x4. A clear shield was taped over the eye. The patient was taken to the post-operative care unit in good condition, having tolerated the procedure well.  Post-Op Instructions: The [facility] day post-operative evaluation and will receive all other orders and instructions.

## 2022-05-25 NOTE — Transfer of Care (Signed)
Immediate Anesthesia Transfer of Care Note  Patient: Joseph Vaughn  Procedure(s) Performed: CATARACT EXTRACTION PHACO AND INTRAOCULAR LENS PLACEMENT (IOC) (Right: Eye)  Patient Location: Short Stay  Anesthesia Type:MAC  Level of Consciousness: awake  Airway & Oxygen Therapy: Patient Spontanous Breathing  Post-op Assessment: Report given to RN  Post vital signs: Reviewed  Last Vitals:  Vitals Value Taken Time  BP    Temp    Pulse    Resp    SpO2      Last Pain:  Vitals:   05/25/22 0744  TempSrc: Oral  PainSc:          Complications: No notable events documented.

## 2022-05-25 NOTE — Anesthesia Preprocedure Evaluation (Signed)
Anesthesia Evaluation  Patient identified by MRN, date of birth, ID band Patient awake    Reviewed: Allergy & Precautions, NPO status , Patient's Chart, lab work & pertinent test results, reviewed documented beta blocker date and time   Airway Mallampati: III  TM Distance: >3 FB Neck ROM: Full   Comment: Neck sx Dental  (+) Dental Advisory Given, Teeth Intact   Pulmonary Current Smoker and Patient abstained from smoking.   Pulmonary exam normal breath sounds clear to auscultation       Cardiovascular Exercise Tolerance: Good hypertension, Pt. on medications and Pt. on home beta blockers + CAD, + Past MI and + Cardiac Stents  Normal cardiovascular exam+ dysrhythmias  Rhythm:Regular Rate:Normal  EKG - NSR, LBBB   Neuro/Psych  Headaches PSYCHIATRIC DISORDERS  Depression Bipolar Disorder      GI/Hepatic Neg liver ROS,GERD  Medicated and Controlled,,Rectal mass s/p TAMIS partial proctectomy 11/09/2020   Endo/Other  negative endocrine ROS    Renal/GU Renal InsufficiencyRenal disease  negative genitourinary   Musculoskeletal negative musculoskeletal ROS (+)    Abdominal   Peds negative pediatric ROS (+)  Hematology negative hematology ROS (+)   Anesthesia Other Findings   Reproductive/Obstetrics negative OB ROS                              Anesthesia Physical Anesthesia Plan  ASA: 3  Anesthesia Plan: MAC   Post-op Pain Management: Minimal or no pain anticipated   Induction: Intravenous  PONV Risk Score and Plan:   Airway Management Planned: Nasal Cannula and Natural Airway  Additional Equipment:   Intra-op Plan:   Post-operative Plan:   Informed Consent: I have reviewed the patients History and Physical, chart, labs and discussed the procedure including the risks, benefits and alternatives for the proposed anesthesia with the patient or authorized representative who has indicated  his/her understanding and acceptance.     Dental advisory given  Plan Discussed with: CRNA and Surgeon  Anesthesia Plan Comments:          Anesthesia Quick Evaluation

## 2022-05-29 ENCOUNTER — Institutional Professional Consult (permissible substitution): Payer: PRIVATE HEALTH INSURANCE | Admitting: Plastic Surgery

## 2022-05-30 ENCOUNTER — Encounter (HOSPITAL_COMMUNITY): Payer: Self-pay | Admitting: Ophthalmology
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
## 2022-08-06 ENCOUNTER — Encounter: Payer: Self-pay | Admitting: Cardiovascular Disease

## 2022-08-06 ENCOUNTER — Ambulatory Visit: Payer: Medicare Other | Attending: Cardiovascular Disease | Admitting: Cardiovascular Disease

## 2022-08-06 VITALS — BP 136/82 | HR 54 | Ht 65.0 in | Wt 151.4 lb

## 2022-08-06 DIAGNOSIS — I2510 Atherosclerotic heart disease of native coronary artery without angina pectoris: Secondary | ICD-10-CM | POA: Diagnosis not present

## 2022-08-06 DIAGNOSIS — I447 Left bundle-branch block, unspecified: Secondary | ICD-10-CM | POA: Diagnosis not present

## 2022-08-06 DIAGNOSIS — F319 Bipolar disorder, unspecified: Secondary | ICD-10-CM | POA: Diagnosis not present

## 2022-08-06 DIAGNOSIS — Z720 Tobacco use: Secondary | ICD-10-CM | POA: Diagnosis present

## 2022-08-06 DIAGNOSIS — F17210 Nicotine dependence, cigarettes, uncomplicated: Secondary | ICD-10-CM

## 2022-08-06 DIAGNOSIS — Z122 Encounter for screening for malignant neoplasm of respiratory organs: Secondary | ICD-10-CM

## 2022-08-06 NOTE — Patient Instructions (Signed)
Medication Instructions:  Your physician recommends that you continue on your current medications as directed. Please refer to the Current Medication list given to you today.  *If you need a refill on your cardiac medications before your next appointment, please call your pharmacy*   Lab Work: NONE   If you have labs (blood work) drawn today and your tests are completely normal, you will receive your results only by: [place] (if you have MyChart) OR A paper copy in the mail If you have any lab test that is abnormal or we need to change your treatment, we will call you to review the results.   Testing/Procedures: Lung Cancer Screening CT    Follow-Up: At [facility], you and your health needs are our priority.  As part of our continuing mission to provide you with exceptional heart care, we have created designated Provider Care Teams.  These Care Teams include your primary Cardiologist (physician) and Advanced Practice Providers (APPs -  Physician Assistants and Nurse Practitioners) who all work together to provide you with the care you need, when you need it.  We recommend signing up for the patient portal called "MyChart".  Sign up information is provided on this After Visit Summary.  MyChart is used to connect with patients for Virtual Visits (Telemedicine).  Patients are able to view lab/test results, encounter notes, upcoming appointments, etc.  Non-urgent messages can be sent to your provider as well.   To learn more about what you can do with MyChart, go to [email or web address].    Your next appointment:   6 month(s)  Provider:   You may see Jenkins Rouge, MD or one of the following Advanced Practice Providers on your designated Care Team:   Bernerd Pho, PA-C  Ermalinda Barrios, PA-C     Other Instructions Thank you for choosing [facility]!

## 2022-08-24 ENCOUNTER — Ambulatory Visit (INDEPENDENT_AMBULATORY_CARE_PROVIDER_SITE_OTHER): Payer: Medicare Other | Admitting: Urology

## 2022-08-24 ENCOUNTER — Ambulatory Visit (HOSPITAL_COMMUNITY)
Admission: RE | Admit: 2022-08-24 | Discharge: 2022-08-24 | Disposition: A | Discharge status: Home / Self Care | Payer: Medicare Other | Source: Ambulatory Visit | Attending: Urology | Admitting: Urology

## 2022-08-24 ENCOUNTER — Encounter: Payer: Self-pay | Admitting: Urology

## 2022-08-24 VITALS — BP 114/71 | HR 76

## 2022-08-24 DIAGNOSIS — N200 Calculus of kidney: Secondary | ICD-10-CM | POA: Diagnosis present

## 2022-08-24 LAB — URINALYSIS, ROUTINE W REFLEX MICROSCOPIC
Bilirubin, UA: NEGATIVE
Glucose, UA: NEGATIVE
Ketones, UA: NEGATIVE
Leukocytes,UA: NEGATIVE
Nitrite, UA: NEGATIVE
RBC, UA: NEGATIVE
Specific Gravity, UA: 1.03 (ref 1.005–1.030)
Urobilinogen, Ur: 0.2 mg/dL (ref 0.2–1.0)
pH, UA: 5 (ref 5.0–7.5)

## 2022-08-24 LAB — MICROSCOPIC EXAMINATION: Bacteria, UA: NONE SEEN
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
## 2022-08-24 NOTE — Patient Instructions (Signed)

## 2022-09-10 ENCOUNTER — Ambulatory Visit: Payer: Medicare Other | Admitting: Family Medicine

## 2022-09-10 ENCOUNTER — Encounter: Payer: Self-pay | Admitting: Family Medicine

## 2022-09-10 VITALS — BP 138/64 | HR 72 | Temp 97.7°F | Ht 65.0 in | Wt 154.0 lb

## 2022-09-10 DIAGNOSIS — L659 Nonscarring hair loss, unspecified: Secondary | ICD-10-CM

## 2022-09-10 LAB — CBC WITH DIFFERENTIAL/PLATELET
Basophils Absolute: 0.1 10*3/uL (ref 0.0–0.1)
Basophils Relative: 1.2 % (ref 0.0–3.0)
Eosinophils Absolute: 0.4 10*3/uL (ref 0.0–0.7)
Eosinophils Relative: 3.3 % (ref 0.0–5.0)
HCT: 40.2 % (ref 39.0–52.0)
Hemoglobin: 13.6 g/dL (ref 13.0–17.0)
Lymphocytes Relative: 22.1 % (ref 12.0–46.0)
Lymphs Abs: 2.4 10*3/uL (ref 0.7–4.0)
MCHC: 33.9 g/dL (ref 30.0–36.0)
MCV: 90.8 fl (ref 78.0–100.0)
Monocytes Absolute: 1 10*3/uL (ref 0.1–1.0)
Monocytes Relative: 9.4 % (ref 3.0–12.0)
Neutro Abs: 6.9 10*3/uL (ref 1.4–7.7)
Neutrophils Relative %: 64 % (ref 43.0–77.0)
Platelets: 246 10*3/uL (ref 150.0–400.0)
RBC: 4.42 Mil/uL (ref 4.22–5.81)
RDW: 13.3 % (ref 11.5–15.5)
WBC: 10.8 10*3/uL — ABNORMAL HIGH (ref 4.0–10.5)

## 2022-09-10 LAB — COMPREHENSIVE METABOLIC PANEL
ALT: 19 U/L (ref 0–53)
AST: 23 U/L (ref 0–37)
Albumin: 4.3 g/dL (ref 3.5–5.2)
Alkaline Phosphatase: 74 U/L (ref 39–117)
BUN: 11 mg/dL (ref 6–23)
CO2: 30 mEq/L (ref 19–32)
Calcium: 10.2 mg/dL (ref 8.4–10.5)
Chloride: 106 mEq/L (ref 96–112)
Creatinine, Ser: 1.69 mg/dL — ABNORMAL HIGH (ref 0.40–1.50)
GFR: 41.89 mL/min — ABNORMAL LOW (ref >60.00)
Glucose, Bld: 75 mg/dL (ref 70–99)
Potassium: 4.1 mEq/L (ref 3.5–5.1)
Sodium: 143 mEq/L (ref 135–145)
Total Bilirubin: 0.3 mg/dL (ref 0.2–1.2)
Total Protein: 6.8 g/dL (ref 6.0–8.3)

## 2022-09-10 LAB — TSH: TSH: 1.9 u[IU]/mL (ref 0.35–5.50)

## 2022-09-10 NOTE — Patient Instructions (Addendum)
Go to the lab on the way out.   If you have mychart we'll likely use that to update you.    Take care.  Glad to see you. 

## 2022-09-10 NOTE — Progress Notes (Unsigned)
Hair loss, going on for months.  No new meds.  No known bald patches. No FCNAVD.  Not sleeping well, didn't sleep well last night.  Still on lamictal and wellbutrin at baseline.  Rarely used hydroxyzine due to sig effect- d/w pt about potentially lowering his dose. He is dealing with separation, sig financial changes.  No SI/HI.  D/w pt about psych follow up.  Minimal fruit intake, ie minimal vit C intake.  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  No bleeding o/w.   Meds, vitals, and allergies reviewed.   ROS: Per HPI unless specifically indicated in ROS section   Nad Ncat Neck supple, no LA Rrr Ctab Abd soft, not ttp Scalp appears normal without any patchy hair loss.

## 2022-09-12 DIAGNOSIS — L659 Nonscarring hair loss, unspecified: Secondary | ICD-10-CM | POA: Insufficient documentation

## 2022-09-12 NOTE — Assessment & Plan Note (Signed)
Does not have patchy focal changes.  Discussed broad differential.  Could be exacerbated by social stressors.  Check labs.  See notes on labs.  I asked him to follow-up with psychiatry about insomnia.

## 2022-09-16 ENCOUNTER — Other Ambulatory Visit: Payer: Self-pay | Admitting: Family Medicine

## 2022-09-16 DIAGNOSIS — E54 Ascorbic acid deficiency: Secondary | ICD-10-CM

## 2022-09-16 LAB — VITAMIN C: Vitamin C: 0.1 mg/dL — ABNORMAL LOW (ref 0.2–2.1)

## 2022-09-16 MED ORDER — VITAMIN C 1000 MG PO TABS: 1000.0000 mg | ORAL_TABLET | Freq: Every day | ORAL | Status: AC

## 2022-09-17 ENCOUNTER — Telehealth: Payer: Self-pay | Admitting: Cardiovascular Disease

## 2022-09-17 ENCOUNTER — Ambulatory Visit (HOSPITAL_COMMUNITY)
Admission: RE | Admit: 2022-09-17 | Discharge: 2022-09-17 | Disposition: A | Discharge status: Home / Self Care | Payer: Medicare Other | Source: Ambulatory Visit | Attending: Cardiovascular Disease | Admitting: Cardiovascular Disease

## 2022-09-17 DIAGNOSIS — Z720 Tobacco use: Secondary | ICD-10-CM | POA: Insufficient documentation

## 2022-09-17 DIAGNOSIS — F17210 Nicotine dependence, cigarettes, uncomplicated: Secondary | ICD-10-CM | POA: Insufficient documentation

## 2022-09-17 DIAGNOSIS — Z122 Encounter for screening for malignant neoplasm of respiratory organs: Secondary | ICD-10-CM | POA: Diagnosis present

## 2022-09-17 NOTE — Telephone Encounter (Signed)
Pt c/o medication issue:  1. Name of Medication: ticagrelor (BRILINTA) 90 MG TABS tablet   2. How are you currently taking this medication (dosage and times per day)? TAKE 1 TABLET BY MOUTH TWICE DAILY   3. Are you having a reaction (difficulty breathing--STAT)? No  4. What is your medication issue? Pt is calling because he has paperwork that needs to be filled out for patient assistance. Please advise

## 2022-09-19 ENCOUNTER — Other Ambulatory Visit: Payer: Self-pay | Admitting: Cardiovascular Disease

## 2022-09-19 ENCOUNTER — Other Ambulatory Visit: Payer: Self-pay | Admitting: Gastroenterology

## 2022-09-19 DIAGNOSIS — K219 Gastro-esophageal reflux disease without esophagitis: Secondary | ICD-10-CM

## 2022-09-24 NOTE — Telephone Encounter (Signed)
**Note De-Identified Joseph Vaughn Obfuscation** The pt states that he is dropping his [place]Brilinta application off at the office today. He is aware that I will complete the providers page of his application and that we will fax all to [place] [employer] [place].  He thanked me for calling him to discuss.

## 2022-10-17 ENCOUNTER — Other Ambulatory Visit: Payer: Medicare Other

## 2022-10-25 ENCOUNTER — Other Ambulatory Visit: Payer: Medicare Other

## 2022-10-25 DIAGNOSIS — E54 Ascorbic acid deficiency: Secondary | ICD-10-CM

## 2022-11-02 LAB — VITAMIN C: Vitamin C: 1.4 mg/dL (ref 0.2–2.1)

## 2022-11-15 ENCOUNTER — Other Ambulatory Visit: Payer: Self-pay | Admitting: Cardiovascular Disease

## 2023-01-29 ENCOUNTER — Other Ambulatory Visit: Payer: Self-pay | Admitting: Cardiovascular Disease

## 2023-02-18 ENCOUNTER — Ambulatory Visit: Payer: PRIVATE HEALTH INSURANCE | Admitting: Urology

## 2023-02-23 ENCOUNTER — Other Ambulatory Visit: Payer: Self-pay | Admitting: Gastroenterology

## 2023-02-23 DIAGNOSIS — K219 Gastro-esophageal reflux disease without esophagitis: Secondary | ICD-10-CM

## 2023-02-25 ENCOUNTER — Other Ambulatory Visit: Payer: Self-pay | Admitting: Cardiovascular Disease

## 2023-02-25 ENCOUNTER — Telehealth: Payer: Self-pay

## 2023-02-25 DIAGNOSIS — K219 Gastro-esophageal reflux disease without esophagitis: Secondary | ICD-10-CM

## 2023-02-25 MED ORDER — PANTOPRAZOLE SODIUM 40 MG PO TBEC
40.0000 mg | DELAYED_RELEASE_TABLET | Freq: Every day | ORAL | 3 refills | Status: DC
Start: 2023-02-25 — End: 2024-04-22

## 2023-02-25 MED ORDER — ROSUVASTATIN CALCIUM 40 MG PO TABS: 40.0000 mg | ORAL_TABLET | Freq: Every day | ORAL | 3 refills | Status: DC

## 2023-02-25 NOTE — Telephone Encounter (Signed)
-----   Message from Charlton Haws sent at 02/25/2023 12:40 PM EDT ----- Rip Harbour to refill crestor and protonix call patient to confirm pharmacy

## 2023-02-25 NOTE — Telephone Encounter (Signed)
Spoke to pt who verified pharmacy. Medication refill completed.

## 2023-05-02 ENCOUNTER — Telehealth: Payer: Self-pay | Admitting: Family Medicine

## 2023-05-02 NOTE — Telephone Encounter (Signed)
Contacted Joseph Vaughn to schedule their annual wellness visit. Patient declined to schedule AWV at this time. No longer living in [place].  [facility] Care Guide [facility] AWV TEAM Direct Dial: [phone]

## 2023-05-13 ENCOUNTER — Telehealth: Payer: Self-pay | Admitting: Family Medicine

## 2023-05-13 NOTE — Telephone Encounter (Signed)
 Contacted Joseph Vaughn to schedule their annual wellness visit. Patient declined to schedule AWV at this time. No longer living in [place].  [facility] Care Guide [facility] AWV TEAM Direct Dial: [phone]

## 2023-08-09 ENCOUNTER — Other Ambulatory Visit: Payer: Self-pay | Admitting: Cardiovascular Disease

## 2023-08-09 MED ORDER — TICAGRELOR 90 MG PO TABS: 90.0000 mg | ORAL_TABLET | Freq: Two times a day (BID) | ORAL | 0 refills | Status: DC

## 2023-09-06 ENCOUNTER — Telehealth: Payer: Self-pay | Admitting: Cardiovascular Disease

## 2023-09-06 MED ORDER — TICAGRELOR 90 MG PO TABS: 90.0000 mg | ORAL_TABLET | Freq: Two times a day (BID) | ORAL | 1 refills | Status: DC

## 2023-09-06 NOTE — Telephone Encounter (Signed)
 Pt's medication was sent to pt's pharmacy as requested. Confirmation received.

## 2023-09-06 NOTE — Telephone Encounter (Signed)
*  STAT* If patient is at the pharmacy, call can be transferred to refill team.   1. Which medications need to be refilled? (please list name of each medication and dose if known)   ticagrelor (BRILINTA) 90 MG TABS tablet   2. Would you like to learn more about the convenience, safety, & potential cost savings by using the [facility]?   3. Are you open to using the [facility] (Type [facility]. ).  4. Which pharmacy/location (including street and city if local pharmacy) is medication to be sent to?  [facility] [phone] - [place], [place] - [address]   5. Do they need a 30 day or 90 day supply?   30 day   Patient stated he has some medication left.

## 2023-10-01 NOTE — Progress Notes (Signed)
  Cardiology Office Note:  .   Date:  10/14/2023  ID:  Berta Brittle, DOB 1955-12-13, MRN [id number] PCP: Donnie Galea, MD  [facility] Providers Cardiologist:  Janelle Mediate, MD    History of Present Illness: Joseph Vaughn   GIO LOCKE is a 68 y.o. male with history of CAD DES p/mLAD and distal Cfx 2019, repeat cath 2020 patent stents, normal LVEF 55-60%.February 2021 admitted with chest pain CTA negative PE/Dissection and troponin's were negative Echo 07/21/19 normal EF 55-60% mild MR. Also has Chronic LBBB, HLD, tobacco, bipolar disorder.  Patient comes in for regular f/u. 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 He's done 8 states recently. Ran out of his brilinta  and was only taking one a day for awhile. Still smoking 1/2 ppd. No regular exercise. Drinks some beer sometimes light other times heavy.     ROS:    Studies Reviewed: Joseph Vaughn         Prior CV Studies:   [facility] 08/2018 Previously placed Prox LAD to Mid LAD drug eluting stent is widely patent. Balloon angioplasty was performed. Dist RCA lesion is 30% stenosed. Previously placed Dist Cx stent (unknown type) is widely patent. The left ventricular systolic function is normal. LV end diastolic pressure is normal. The left ventricular ejection fraction is 55-65% by visual estimate.   1.  Widely patent LAD and left circumflex stents with no evidence of obstructive coronary artery disease. 2.  Normal LV systolic function and left ventricular end-diastolic pressure.  Risk Assessment/Calculations:             Physical Exam:   VS:  BP 116/64 (BP Location: Left Arm, Patient Position: Sitting, Cuff Size: Normal)   Pulse (!) 56   Ht 5\' 5"  (1.651 m)   Wt 166 lb 9.6 oz (75.6 kg)   SpO2 98%   BMI 27.72 kg/m    Wt Readings from Last 3 Encounters:  10/14/23 166 lb 9.6 oz (75.6 kg)  09/10/22 154 lb (69.9 kg)  08/06/22 151 lb 6.4 oz (68.7 kg)    GEN: Well nourished, well developed in no acute distress NECK: No JVD; No carotid  bruits CARDIAC:  RRR, no murmurs, rubs, gallops RESPIRATORY:  Clear to auscultation without rales, wheezing or rhonchi  ABDOMEN: Soft, non-tender, non-distended EXTREMITIES:  No edema; No deformity   ASSESSMENT AND PLAN: .    CAD:  See HPI multiple previous stents widely patent cath 08/13/18 medical Rx Indefinite DAT, ran out of Brilinta  since he's traveling in his RV full time, going to cost him $500. Will check with Dr. Nishan and see if we can switch to plavix. Check labs today -no angina, 150 min exercise weekly  HLD:  On statin LDL 54 10/27/21 recheck today  LBBB:  Chronic yearly ECG no significant AV block  Smoking:  Counseled on cessation          Dispo: f/u in 1 yr.  Signed, Theotis Flake, PA-C

## 2023-10-14 ENCOUNTER — Ambulatory Visit: Admitting: Physician Assistant

## 2023-10-14 ENCOUNTER — Other Ambulatory Visit (HOSPITAL_COMMUNITY)
Admission: RE | Admit: 2023-10-14 | Discharge: 2023-10-14 | Disposition: A | Discharge status: Home / Self Care | Source: Ambulatory Visit | Attending: Physician Assistant | Admitting: Physician Assistant

## 2023-10-14 ENCOUNTER — Encounter: Payer: Self-pay | Admitting: Physician Assistant

## 2023-10-14 VITALS — BP 116/64 | HR 56 | Ht 65.0 in | Wt 166.6 lb

## 2023-10-14 DIAGNOSIS — I447 Left bundle-branch block, unspecified: Secondary | ICD-10-CM

## 2023-10-14 DIAGNOSIS — E7800 Pure hypercholesterolemia, unspecified: Secondary | ICD-10-CM

## 2023-10-14 DIAGNOSIS — Z79899 Other long term (current) drug therapy: Secondary | ICD-10-CM | POA: Diagnosis present

## 2023-10-14 DIAGNOSIS — I2510 Atherosclerotic heart disease of native coronary artery without angina pectoris: Secondary | ICD-10-CM | POA: Insufficient documentation

## 2023-10-14 DIAGNOSIS — Z720 Tobacco use: Secondary | ICD-10-CM

## 2023-10-14 LAB — COMPREHENSIVE METABOLIC PANEL WITH GFR
ALT: 13 U/L (ref 0–44)
AST: 20 U/L (ref 15–41)
Albumin: 3.6 g/dL (ref 3.5–5.0)
Alkaline Phosphatase: 59 U/L (ref 38–126)
Anion gap: 10 (ref 5–15)
BUN: 12 mg/dL (ref 8–23)
CO2: 25 mmol/L (ref 22–32)
Calcium: 9.1 mg/dL (ref 8.9–10.3)
Chloride: 100 mmol/L (ref 98–111)
Creatinine, Ser: 1.67 mg/dL — ABNORMAL HIGH (ref 0.61–1.24)
GFR, Estimated: 45 mL/min — ABNORMAL LOW (ref >60)
Glucose, Bld: 107 mg/dL — ABNORMAL HIGH (ref 70–99)
Potassium: 4.8 mmol/L (ref 3.5–5.1)
Sodium: 135 mmol/L (ref 135–145)
Total Bilirubin: 0.4 mg/dL (ref 0.0–1.2)
Total Protein: 6.9 g/dL (ref 6.5–8.1)

## 2023-10-14 LAB — LIPID PANEL
Cholesterol: 97 mg/dL (ref 0–200)
HDL: 36 mg/dL — ABNORMAL LOW (ref >40)
LDL Cholesterol: 39 mg/dL (ref 0–99)
Total CHOL/HDL Ratio: 2.7 ratio
Triglycerides: 110 mg/dL (ref <150)
VLDL: 22 mg/dL (ref 0–40)

## 2023-10-14 LAB — TSH: TSH: 1.39 u[IU]/mL (ref 0.350–4.500)

## 2023-10-14 LAB — CBC
HCT: 40.2 % (ref 39.0–52.0)
Hemoglobin: 13.5 g/dL (ref 13.0–17.0)
MCH: 30.8 pg (ref 26.0–34.0)
MCHC: 33.6 g/dL (ref 30.0–36.0)
MCV: 91.8 fL (ref 80.0–100.0)
Platelets: 216 10*3/uL (ref 150–400)
RBC: 4.38 MIL/uL (ref 4.22–5.81)
RDW: 12.4 % (ref 11.5–15.5)
WBC: 7.8 10*3/uL (ref 4.0–10.5)
nRBC: 0 % (ref 0.0–0.2)

## 2023-10-14 MED ORDER — TICAGRELOR 90 MG PO TABS: 90.0000 mg | ORAL_TABLET | Freq: Two times a day (BID) | ORAL | 6 refills | Status: DC

## 2023-10-14 NOTE — Patient Instructions (Addendum)
 Medication Instructions:  Your physician recommends that you continue on your current medications as directed. Please refer to the Current Medication list given to you today.  Labwork: Your physician recommends that you return for a FASTING lipid profile, CMET, CBC & TSH today at [facility].  Testing/Procedures: none  Follow-Up: Your physician recommends that you schedule a follow-up appointment in: 1 year with Dr. Stann Earnest. You will receive a reminder call in about 8-10 months reminding you to schedule your appointment. If you don't receive this call, please contact our office.  Any Other Special Instructions Will Be Listed Below (If Applicable).  If you need a refill on your cardiac medications before your next appointment, please call your pharmacy.

## 2023-10-15 ENCOUNTER — Telehealth: Payer: Self-pay | Admitting: *Deleted

## 2023-10-15 MED ORDER — CLOPIDOGREL BISULFATE 75 MG PO TABS: 75.0000 mg | ORAL_TABLET | Freq: Every day | ORAL | 11 refills | Status: AC

## 2023-10-15 NOTE — Telephone Encounter (Signed)
 Pt notified to stop Brilinta  and start Plavix 75 mg daily. Pt voiced understanding. Order placed.

## 2023-10-15 NOTE — Telephone Encounter (Signed)
-----   Message from Theotis Flake sent at 10/15/2023  7:59 AM EDT ----- Can you let patient know Dr. Stann Earnest says he can switch from Brilinta  to plavix 75 mg once daily if this is more affordable for him. ----- Message ----- From: Loyde Rule, MD Sent: 10/14/2023   5:37 PM EDT To: Flo Hummingbird, PA-C; Aubry Blase, LPN  That's fine ----- Message ----- From: Flo Hummingbird, PA-C Sent: 10/14/2023   1:37 PM EDT To: Loyde Rule, MD  Dr. Stann Earnest,  This patient is having trouble affording his Brilinta . Going to cost him  $500. Could we try to switch him to Plavix? Thanks, Ludwig Safer

## 2023-11-28 ENCOUNTER — Other Ambulatory Visit: Payer: Self-pay

## 2023-11-28 DIAGNOSIS — N200 Calculus of kidney: Secondary | ICD-10-CM

## 2023-11-29 ENCOUNTER — Ambulatory Visit (HOSPITAL_COMMUNITY)
Admission: RE | Admit: 2023-11-29 | Discharge: 2023-11-29 | Disposition: A | Discharge status: Home / Self Care | Source: Ambulatory Visit | Attending: Urology | Admitting: Urology

## 2023-11-29 ENCOUNTER — Ambulatory Visit (INDEPENDENT_AMBULATORY_CARE_PROVIDER_SITE_OTHER): Admitting: Urology

## 2023-11-29 ENCOUNTER — Encounter: Payer: Self-pay | Admitting: Urology

## 2023-11-29 VITALS — BP 110/59 | HR 74

## 2023-11-29 DIAGNOSIS — N200 Calculus of kidney: Secondary | ICD-10-CM

## 2023-11-29 LAB — URINALYSIS, ROUTINE W REFLEX MICROSCOPIC
Bilirubin, UA: NEGATIVE
Glucose, UA: NEGATIVE
Ketones, UA: NEGATIVE
Leukocytes,UA: NEGATIVE
Nitrite, UA: NEGATIVE
Protein,UA: NEGATIVE
Specific Gravity, UA: 1.03 (ref 1.005–1.030)
Urobilinogen, Ur: 0.2 mg/dL (ref 0.2–1.0)
pH, UA: 6 (ref 5.0–7.5)

## 2023-11-29 LAB — MICROSCOPIC EXAMINATION
Bacteria, UA: NONE SEEN
WBC, UA: NONE SEEN /HPF (ref 0–5)

## 2023-11-29 MED ORDER — ONDANSETRON HCL 4 MG PO TABS: 4.0000 mg | ORAL_TABLET | Freq: Three times a day (TID) | ORAL | 0 refills | Status: AC | PRN

## 2023-11-29 MED ORDER — OXYCODONE-ACETAMINOPHEN 5-325 MG PO TABS: 1.0000 | ORAL_TABLET | ORAL | 0 refills | Status: AC | PRN

## 2023-11-29 MED ORDER — TAMSULOSIN HCL 0.4 MG PO CAPS: 0.4000 mg | ORAL_CAPSULE | Freq: Every day | ORAL | 0 refills | Status: DC
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
## 2023-11-29 NOTE — Patient Instructions (Signed)

## 2023-12-03 ENCOUNTER — Other Ambulatory Visit: Payer: Self-pay

## 2023-12-03 MED ORDER — METOPROLOL SUCCINATE ER 25 MG PO TB24: 25.0000 mg | ORAL_TABLET | Freq: Every day | ORAL | 3 refills | Status: AC

## 2023-12-09 ENCOUNTER — Ambulatory Visit: Admitting: Family Medicine

## 2023-12-09 ENCOUNTER — Ambulatory Visit

## 2023-12-25 ENCOUNTER — Telehealth: Payer: Self-pay

## 2023-12-25 ENCOUNTER — Ambulatory Visit (HOSPITAL_COMMUNITY)
Admission: RE | Admit: 2023-12-25 | Discharge: 2023-12-25 | Disposition: A | Discharge status: Home / Self Care | Source: Ambulatory Visit | Attending: Urology | Admitting: Urology

## 2023-12-25 ENCOUNTER — Encounter: Payer: Self-pay | Admitting: Urology

## 2023-12-25 ENCOUNTER — Other Ambulatory Visit: Payer: Self-pay | Admitting: Urology

## 2023-12-25 ENCOUNTER — Telehealth: Payer: Self-pay | Admitting: Urology

## 2023-12-25 ENCOUNTER — Ambulatory Visit (INDEPENDENT_AMBULATORY_CARE_PROVIDER_SITE_OTHER): Admitting: Urology

## 2023-12-25 VITALS — BP 142/72 | HR 82

## 2023-12-25 DIAGNOSIS — N200 Calculus of kidney: Secondary | ICD-10-CM

## 2023-12-25 DIAGNOSIS — R109 Unspecified abdominal pain: Secondary | ICD-10-CM | POA: Diagnosis not present

## 2023-12-25 DIAGNOSIS — K589 Irritable bowel syndrome without diarrhea: Secondary | ICD-10-CM

## 2023-12-25 LAB — URINALYSIS, ROUTINE W REFLEX MICROSCOPIC
Bilirubin, UA: NEGATIVE
Glucose, UA: NEGATIVE
Ketones, UA: NEGATIVE
Leukocytes,UA: NEGATIVE
Nitrite, UA: NEGATIVE
Specific Gravity, UA: 1.03 (ref 1.005–1.030)
Urobilinogen, Ur: 0.2 mg/dL (ref 0.2–1.0)
pH, UA: 6 (ref 5.0–7.5)

## 2023-12-25 LAB — MICROSCOPIC EXAMINATION
Bacteria, UA: NONE SEEN
RBC, Urine: >30 /HPF — AB (ref 0–2)

## 2023-12-25 MED ORDER — HYOSCYAMINE SULFATE 0.125 MG PO TABS: 0.1250 mg | ORAL_TABLET | ORAL | 0 refills | Status: DC | PRN
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
## 2023-12-25 NOTE — Telephone Encounter (Signed)
 Patient having severe pain in right side , he believes them to be kidney stones , what should he do?

## 2023-12-25 NOTE — Telephone Encounter (Signed)
 Pt was informed to get KUB prior to appointment.

## 2023-12-25 NOTE — Telephone Encounter (Signed)
 Pain started: 07/14 then when away then came back 1 am 07/16 Pain scale: 9  Pain site: right side of pt back and lower right conner of abdomin Pain Management: oxycodone  makes pt sick, has been throwing up (6 times)  Passed kidney stone on the left side 1 week ago (pt believes it is now a kidney stone on the right)  Now having pain on the right side  Urinating blood yesterday and last night but not currently/ not that pt can see   Per verbal from MD McKenzie pt needs xray and appt w/ as soon as possible   Pt scheduled for OV w/ NP [place] and advised to get xray prior to appt

## 2023-12-26 ENCOUNTER — Ambulatory Visit: Payer: Self-pay | Admitting: Urology

## 2023-12-26 ENCOUNTER — Other Ambulatory Visit: Payer: Self-pay

## 2023-12-26 ENCOUNTER — Telehealth: Payer: Self-pay | Admitting: Urology

## 2023-12-26 DIAGNOSIS — N201 Calculus of ureter: Secondary | ICD-10-CM

## 2023-12-26 MED ORDER — TAMSULOSIN HCL 0.4 MG PO CAPS: 0.4000 mg | ORAL_CAPSULE | Freq: Every day | ORAL | 5 refills | Status: AC

## 2023-12-26 NOTE — Telephone Encounter (Signed)
 Patient returned call requesting CT results.   Test was completed on 12/25/23.   Best number to contact patient [phone] Call transferred to clinical basket. Notified patient someone would return their call in 24 hours.   Patient is having a lot of pain and wants to know what the next step is going to be?

## 2023-12-26 NOTE — Telephone Encounter (Signed)
 FYI and advise please

## 2023-12-26 NOTE — Progress Notes (Signed)
 Spoke with patient about CT finding of right proximal ureteral stone, which was not seen on KUB. He reports that his right flank/RLQ pain is intermittent and manageable at this time. We discussed option to schedule right ureteroscopic stone manipulation; he prefers to continue medical expulsive therapy with Flomax  (Tamsulosin ) 0.4 mg daily for now with RUS and f/u in 2 weeks. He was advised that repeat CT stone study may be needed at that time if still symptomatic since the stone is radiolucent. He was advised to go to the ER if He develops fever >100.5 F, uncontrollable pain, or other significantly concerning symptoms. Pt verbalized understanding and agreement. All questions were answered.

## 2024-01-02 ENCOUNTER — Other Ambulatory Visit: Payer: Self-pay | Admitting: Urology

## 2024-01-02 DIAGNOSIS — R109 Unspecified abdominal pain: Secondary | ICD-10-CM

## 2024-01-02 DIAGNOSIS — K589 Irritable bowel syndrome without diarrhea: Secondary | ICD-10-CM

## 2024-01-04 ENCOUNTER — Other Ambulatory Visit: Payer: Self-pay | Admitting: Urology

## 2024-01-04 DIAGNOSIS — K589 Irritable bowel syndrome without diarrhea: Secondary | ICD-10-CM

## 2024-01-04 DIAGNOSIS — R109 Unspecified abdominal pain: Secondary | ICD-10-CM

## 2024-03-16 ENCOUNTER — Other Ambulatory Visit (HOSPITAL_COMMUNITY): Payer: Self-pay | Admitting: Urology

## 2024-03-16 ENCOUNTER — Telehealth: Payer: Self-pay | Admitting: Urology

## 2024-03-16 ENCOUNTER — Ambulatory Visit (HOSPITAL_COMMUNITY)
Admission: RE | Admit: 2024-03-16 | Discharge: 2024-03-16 | Disposition: A | Discharge status: Home / Self Care | Source: Ambulatory Visit | Attending: Urology | Admitting: Urology

## 2024-03-16 DIAGNOSIS — R10A0 Flank pain, unspecified side: Secondary | ICD-10-CM

## 2024-03-16 DIAGNOSIS — N200 Calculus of kidney: Secondary | ICD-10-CM | POA: Diagnosis present

## 2024-03-16 NOTE — Telephone Encounter (Signed)
 Patient called into the office today with general questions/concerns regarding kidney stone. He is having pain Patient may be reached at [phone] to discuss questions.

## 2024-03-16 NOTE — Telephone Encounter (Signed)
 Return call to pt making him aware there is an KUB order in for him to have an x-ray so Dr.McKenzie will no how to treat pt. Verbalized understanding.

## 2024-03-17 NOTE — Telephone Encounter (Signed)
 Return call to pt. Pt is aware X-ray was routed to MD, Mckenzie to review. Pt state's that the wrong side was x-ray and it should of been the right side. Pt state's he is aware of the left side.  Verbalize understanding.

## 2024-03-17 NOTE — Telephone Encounter (Signed)
 Patient returned call requesting XRAY results.   Test was completed on 03/16/2024.   Best number to contact patient [phone] Call transferred to clinical basket.

## 2024-03-18 ENCOUNTER — Ambulatory Visit: Payer: Self-pay

## 2024-03-18 NOTE — Telephone Encounter (Signed)
 Pt called back and state's that he has a serious problem. Ask what problem is he referring to, pt state's right side hurt along vomiting and bloating. Pt was advised to contact his PCP for an appointment. Voiced understanding

## 2024-03-18 NOTE — Telephone Encounter (Signed)
 Reason for Disposition . [1] MODERATE pain (e.g., interferes with normal activities) AND [2] pain comes and goes (cramps) AND [3] present > 24 hours  (Exception: Pain with Vomiting or Diarrhea - see that Guideline.)  Answer Assessment - Initial Assessment Questions 1. LOCATION: Where does it hurt?      Right side, radiates to the back   2. RADIATION: Does the pain shoot anywhere else? (e.g., chest, back)     Back   3. ONSET: When did the pain begin? (Minutes, hours or days ago)      Last Tuesday   4. SUDDEN: Gradual or sudden onset?     sudden  5. PATTERN Does the pain come and go, or is it constant?     Intermittent   6. SEVERITY: How bad is the pain?  (e.g., Scale 1-10; mild, moderate, or severe)     Denies pain during the call   7. RECURRENT SYMPTOM: Have you ever had this type of stomach pain before? If Yes, ask: When was the last time? and What happened that time?      No   8. CAUSE: What do you think is causing the stomach pain? (e.g., gallstones, recent abdominal surgery)     Unsure  9. RELIEVING/AGGRAVATING FACTORS: What makes it better or worse? (e.g., antacids, bending or twisting motion, bowel movement)      Oxycodone  helps to relieve symptoms   10. OTHER SYMPTOMS: Do you have any other symptoms? (e.g., back pain, diarrhea, fever, urination pain, vomiting)        Vomiting/nausea, not currently.   Pt  suspected  kidney stones and was evaluated by urology;  no kidney stones present. Pt thinks maybe appendix but is unsure. Patient is taking oxycodone  for the pain. Denies pain during the call. He mentioned some vomiting last Tuesday, he has a decreased appetite.  Appt. Scheduled for 10/9 to follow up.  Protocols used: Abdominal Pain - Male-A-AH

## 2024-03-18 NOTE — Telephone Encounter (Signed)
 Thank you for getting patient scheduled.  I would doubt appendicitis if he denies pain during the call.  Was he given ER cautions?

## 2024-03-18 NOTE — Telephone Encounter (Addendum)
 FYI Only or Action Required?: FYI only for provider.  Patient was last seen in primary care on 09/10/2022 by Cleatus Arlyss RAMAN, MD.  Called Nurse Triage reporting Abdominal Pain.  Symptoms began several days ago.  Interventions attempted: Prescription medications: Oxycodone .  Symptoms are: gradually worsening.  Triage Disposition: See Physician Within 24 Hours  Patient/caregiver understands and will follow disposition?: Yes  **Appt. Scheduled for 10/9**        Copied from CRM #[id number]. Topic: Clinical - Red Word Triage >> Mar 18, 2024  2:00 PM Paige D wrote: Red Word that prompteReason for CRM: Severe Pain on right side that goes to the back side. Pt been throwing up, fever and chills.  Pt  thought kidney stone went to see  urology stated no kidney stones. Pt thinks maybe appendixd transfer to Nurse Triage:

## 2024-03-18 NOTE — Telephone Encounter (Signed)
-----   Message from Belvie Clara sent at 03/17/2024  1:32 PM EDT ----- Right side appears to have no stones ----- Message ----- From: Gretta Carlos SAUNDERS, CMA Sent: 03/17/2024   1:23 PM EDT To: Belvie LITTIE Clara, MD  Please review. Wrong side was look at, it should of been the right side

## 2024-03-18 NOTE — Telephone Encounter (Signed)
 Called pt and left detailed vm of MD recommendations. Verbalized understanding

## 2024-03-19 ENCOUNTER — Emergency Department (HOSPITAL_COMMUNITY)
Admission: EM | Admit: 2024-03-19 | Discharge: 2024-03-19 | Disposition: A | Discharge status: Home / Self Care | Attending: Emergency Medicine | Admitting: Emergency Medicine

## 2024-03-19 ENCOUNTER — Encounter: Payer: Self-pay | Admitting: Family

## 2024-03-19 ENCOUNTER — Ambulatory Visit (HOSPITAL_COMMUNITY)
Admission: RE | Admit: 2024-03-19 | Discharge: 2024-03-19 | Disposition: A | Discharge status: Home / Self Care | Source: Ambulatory Visit | Attending: Family | Admitting: Family

## 2024-03-19 ENCOUNTER — Ambulatory Visit: Payer: Self-pay | Admitting: Family

## 2024-03-19 ENCOUNTER — Ambulatory Visit (INDEPENDENT_AMBULATORY_CARE_PROVIDER_SITE_OTHER): Admitting: Family

## 2024-03-19 ENCOUNTER — Encounter (HOSPITAL_COMMUNITY): Payer: Self-pay | Admitting: Emergency Medicine

## 2024-03-19 ENCOUNTER — Other Ambulatory Visit: Payer: Self-pay | Admitting: Family

## 2024-03-19 VITALS — BP 152/90 | HR 86 | Temp 98.2°F | Ht 65.0 in | Wt 163.0 lb

## 2024-03-19 DIAGNOSIS — R1032 Left lower quadrant pain: Secondary | ICD-10-CM

## 2024-03-19 DIAGNOSIS — R1031 Right lower quadrant pain: Secondary | ICD-10-CM

## 2024-03-19 DIAGNOSIS — R1085 Abdominal pain of multiple sites: Secondary | ICD-10-CM | POA: Diagnosis not present

## 2024-03-19 DIAGNOSIS — R300 Dysuria: Secondary | ICD-10-CM

## 2024-03-19 DIAGNOSIS — N289 Disorder of kidney and ureter, unspecified: Secondary | ICD-10-CM | POA: Diagnosis not present

## 2024-03-19 DIAGNOSIS — R319 Hematuria, unspecified: Secondary | ICD-10-CM | POA: Diagnosis not present

## 2024-03-19 DIAGNOSIS — R1114 Bilious vomiting: Secondary | ICD-10-CM | POA: Diagnosis not present

## 2024-03-19 DIAGNOSIS — N132 Hydronephrosis with renal and ureteral calculous obstruction: Secondary | ICD-10-CM | POA: Insufficient documentation

## 2024-03-19 DIAGNOSIS — R109 Unspecified abdominal pain: Secondary | ICD-10-CM | POA: Diagnosis present

## 2024-03-19 DIAGNOSIS — Z7982 Long term (current) use of aspirin: Secondary | ICD-10-CM | POA: Insufficient documentation

## 2024-03-19 DIAGNOSIS — R7989 Other specified abnormal findings of blood chemistry: Secondary | ICD-10-CM

## 2024-03-19 DIAGNOSIS — N200 Calculus of kidney: Secondary | ICD-10-CM

## 2024-03-19 LAB — URINALYSIS, ROUTINE W REFLEX MICROSCOPIC
Bilirubin Urine: NEGATIVE
Bilirubin Urine: NEGATIVE
Glucose, UA: NEGATIVE mg/dL
Ketones, ur: NEGATIVE
Ketones, ur: NEGATIVE mg/dL
Leukocytes,Ua: NEGATIVE
Nitrite: NEGATIVE
Nitrite: NEGATIVE
Protein, ur: >=300 mg/dL — AB
Specific Gravity, Urine: 1.025 (ref 1.000–1.030)
Specific Gravity, Urine: 1.01 (ref 1.005–1.030)
Total Protein, Urine: 30 — AB
Urine Glucose: NEGATIVE
Urobilinogen, UA: 0.2 (ref 0.0–1.0)
pH: 5.5 (ref 5.0–8.0)
pH: 5 (ref 5.0–8.0)

## 2024-03-19 LAB — COMPREHENSIVE METABOLIC PANEL WITH GFR
ALT: 9 U/L (ref 0–53)
AST: 18 U/L (ref 0–37)
Albumin: 4.5 g/dL (ref 3.5–5.2)
Alkaline Phosphatase: 59 U/L (ref 39–117)
BUN: 17 mg/dL (ref 6–23)
CO2: 31 meq/L (ref 19–32)
Calcium: 9.5 mg/dL (ref 8.4–10.5)
Chloride: 101 meq/L (ref 96–112)
Creatinine, Ser: 2.78 mg/dL — ABNORMAL HIGH (ref 0.40–1.50)
GFR: 22.81 mL/min — ABNORMAL LOW (ref >60.00)
Glucose, Bld: 104 mg/dL — ABNORMAL HIGH (ref 70–99)
Potassium: 4.3 meq/L (ref 3.5–5.1)
Sodium: 139 meq/L (ref 135–145)
Total Bilirubin: 0.3 mg/dL (ref 0.2–1.2)
Total Protein: 8 g/dL (ref 6.0–8.3)

## 2024-03-19 LAB — POCT URINALYSIS DIP (CLINITEK)
Bilirubin, UA: NEGATIVE
Glucose, UA: NEGATIVE mg/dL
Ketones, POC UA: NEGATIVE mg/dL
Leukocytes, UA: NEGATIVE
Nitrite, UA: NEGATIVE
Spec Grav, UA: 1.025 (ref 1.010–1.025)
Urobilinogen, UA: 0.2 U/dL
pH, UA: 5 (ref 5.0–8.0)

## 2024-03-19 LAB — CBC WITH DIFFERENTIAL/PLATELET
Basophils Absolute: 0.1 K/uL (ref 0.0–0.1)
Basophils Relative: 0.8 % (ref 0.0–3.0)
Eosinophils Absolute: 0.3 K/uL (ref 0.0–0.7)
Eosinophils Relative: 2.9 % (ref 0.0–5.0)
HCT: 39.9 % (ref 39.0–52.0)
Hemoglobin: 13.5 g/dL (ref 13.0–17.0)
Lymphocytes Relative: 15 % (ref 12.0–46.0)
Lymphs Abs: 1.5 K/uL (ref 0.7–4.0)
MCHC: 33.8 g/dL (ref 30.0–36.0)
MCV: 90.2 fl (ref 78.0–100.0)
Monocytes Absolute: 1.1 K/uL — ABNORMAL HIGH (ref 0.1–1.0)
Monocytes Relative: 11.6 % (ref 3.0–12.0)
Neutro Abs: 6.8 K/uL (ref 1.4–7.7)
Neutrophils Relative %: 69.7 % (ref 43.0–77.0)
Platelets: 282 K/uL (ref 150.0–400.0)
RBC: 4.42 Mil/uL (ref 4.22–5.81)
RDW: 13.2 % (ref 11.5–15.5)
WBC: 9.7 K/uL (ref 4.0–10.5)

## 2024-03-19 LAB — AMYLASE: Amylase: 57 U/L (ref 27–131)

## 2024-03-19 LAB — LIPASE: Lipase: 27 U/L (ref 11.0–59.0)

## 2024-03-19 MED ORDER — SODIUM CHLORIDE 0.9 % IV BOLUS
1000.0000 mL | Freq: Once | INTRAVENOUS | Status: AC
  Administered 2024-03-19: 1000 mL via INTRAVENOUS

## 2024-03-19 NOTE — ED Notes (Signed)
 ED Provider at bedside.

## 2024-03-19 NOTE — Discharge Instructions (Signed)
 He is take the tamsulosin  once a day, drink plenty of clear liquids, return to the ER for severe or worsening symptoms especially fevers vomiting or severe pain

## 2024-03-19 NOTE — Addendum Note (Signed)
 Addended by: ALBINO SHAVER C on: 03/19/2024 10:34 AM   Modules accepted: Orders

## 2024-03-19 NOTE — ED Provider Notes (Signed)
 [place] [facility] AT [facility] Provider Note   CSN: [id number] Arrival date & time: 03/19/24  2116     Patient presents with: Abnormal Lab   Joseph Vaughn is a 68 y.o. male.    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  Associated with this he had nausea and has noticed some hematuria.  Today he had been feeling earlier today and testing was ordered including labs and a CT scan.  This showed that he had a recently passed kidney stone which was in the bladder and there was some residual hydronephrosis in the right ureter and kidney, the patient states that his symptoms have almost completely resolved but he got a phone call tonight from his doctor's office stating that he needed to come to the ER because of the kidney stone and the kidney dysfunction.  He denies fevers or chills  He is under the care of Dr. Sherrilee with urology  Prior to Admission medications   Medication Sig Start Date End Date Taking? Authorizing Provider  Ascorbic Acid (VITAMIN C ) 1000 MG tablet Take 1 tablet (1,000 mg total) by mouth daily. 09/16/22   Cleatus Arlyss RAMAN, MD  aspirin  EC 81 MG EC tablet Take 1 tablet (81 mg total) by mouth daily. 06/04/18   Bhagat, Aleene, PA  buPROPion  (WELLBUTRIN  XL) 150 MG 24 hr tablet Take 150 mg by mouth in the morning. 06/24/19   [provider]  clopidogrel  (PLAVIX ) 75 MG tablet Take 1 tablet (75 mg total) by mouth daily. 10/15/23   Nishan, Peter C, MD  hyoscyamine  (LEVSIN ) 0.125 MG tablet TAKE 1 TABLET(0.125 MG) BY MOUTH EVERY 4 HOURS AS NEEDED 01/06/24   Gerldine Lauraine BROCKS, FNP  lamoTRIgine  (LAMICTAL ) 150 MG tablet Take 150 mg by mouth in the morning.    [provider]  metoprolol  succinate  (TOPROL -XL) 25 MG 24 hr tablet Take 1 tablet (25 mg total) by mouth daily. 12/03/23   Parthenia Olivia HERO, PA-C  nitroGLYCERIN  (NITROSTAT ) 0.4 MG SL tablet Place 1 tablet (0.4 mg total) under the tongue every 5 (five) minutes as needed for chest pain (CP or SOB). 08/02/21   Nishan, Peter C, MD  ondansetron  (ZOFRAN ) 4 MG tablet Take 1 tablet (4 mg total) by mouth every 8 (eight) hours as needed for nausea or vomiting. 11/29/23   McKenzie, Belvie CROME, MD  oxyCODONE -acetaminophen  (PERCOCET) 5-325 MG tablet Take 1 tablet by mouth every 4 (four) hours as needed. 11/29/23   McKenzie, Belvie CROME, MD  pantoprazole  (PROTONIX ) 40 MG tablet Take 1 tablet (40 mg total) by mouth daily. 02/25/23   Delford Maude BROCKS, MD  rosuvastatin  (CRESTOR ) 40 MG tablet Take 1 tablet (40 mg total) by mouth daily. 02/25/23   Delford Maude BROCKS, MD  tamsulosin  (FLOMAX ) 0.4 MG CAPS capsule Take 1 capsule (0.4 mg total) by mouth daily after supper. 12/26/23   McKenzie, Belvie CROME, MD    Allergies: Atorvastatin     Review of Systems  All other systems reviewed and are negative.   Updated Vital Signs BP (!) 174/78 (BP Location: Left Arm)   Pulse 86   Temp 97.7 F (36.5 C) (Oral)   Resp 18   SpO2 93%  Physical Exam Vitals and nursing note reviewed.  Constitutional:      General: He is not in acute distress.    Appearance: He is well-developed.  HENT:     Head: Normocephalic and atraumatic.     Mouth/Throat:     Pharynx: No oropharyngeal exudate.  Eyes:     General: No scleral icterus.       Right eye: No discharge.        Left eye: No discharge.     Conjunctiva/sclera: Conjunctivae normal.     Pupils: Pupils are equal, round, and reactive to light.  Neck:     Thyroid : No thyromegaly.     Vascular: No JVD.  Cardiovascular:     Rate and Rhythm: Normal rate and regular rhythm.     Heart sounds: Normal heart sounds. No murmur heard.    No friction rub. No gallop.  Pulmonary:     Effort: Pulmonary effort is normal. No  respiratory distress.     Breath sounds: Normal breath sounds. No wheezing or rales.  Abdominal:     General: Bowel sounds are normal. There is no distension.     Palpations: Abdomen is soft. There is no mass.     Tenderness: There is no abdominal tenderness.  Musculoskeletal:        General: No tenderness. Normal range of motion.     Cervical back: Normal range of motion and neck supple.     Right lower leg: No edema.     Left lower leg: No edema.  Lymphadenopathy:     Cervical: No cervical adenopathy.  Skin:    General: Skin is warm and dry.     Findings: No erythema or rash.  Neurological:     Mental Status: He is alert.     Coordination: Coordination normal.  Psychiatric:        Behavior: Behavior normal.     (all labs ordered are listed, but only abnormal results are displayed) Labs Reviewed  URINALYSIS, ROUTINE W REFLEX MICROSCOPIC - Abnormal; Notable for the following components:      Result Value   APPearance HAZY (*)    Hgb urine dipstick MODERATE (*)    Protein, ur >=300 (*)    Leukocytes,Ua TRACE (*)    Bacteria, UA RARE (*)    All other components within normal limits    EKG: None  Radiology: CT ABDOMEN PELVIS WO CONTRAST Result Date: 03/19/2024 CLINICAL DATA:  Right lower quadrant abdominal pain EXAM: CT ABDOMEN AND PELVIS WITHOUT CONTRAST TECHNIQUE: Multidetector CT imaging of the abdomen and pelvis was performed following the standard protocol without IV contrast. RADIATION DOSE REDUCTION: This exam was performed according to the departmental dose-optimization program which includes automated exposure control, adjustment of the mA and/or kV according to patient size and/or use of iterative reconstruction technique. COMPARISON:  Abdominal radiographs 03/18/2024. CT abdomen and pelvis 12/25/2023 FINDINGS: Lower chest: Mild dependent changes in the lung bases. Hepatobiliary: No focal liver abnormality is seen. No gallstones, gallbladder wall thickening, or biliary  dilatation. Pancreas: Unremarkable. No pancreatic ductal dilatation or surrounding inflammatory changes. Spleen: Normal in size without focal abnormality. Adrenals/Urinary Tract: No adrenal gland nodules. Large staghorn calculus in the left kidney measuring up to 1.7 cm maximal dimension. No change since prior study. Right-sided hydronephrosis and hydroureter. No right ureteral stones are demonstrated but there is a 7 mm stone in the posterior bladder likely representing a recently passed stone. No bladder wall thickening. Stomach/Bowel: Stomach, small bowel, and colon  are not abnormally distended. Contrast material flows through to the distal small bowel without evidence of bowel obstruction. The appendix is dilated with diameter measuring about 11 mm. No periappendiceal stranding. This likely represents early acute appendicitis. No abscess or appendicolith. Vascular/Lymphatic: Aortic atherosclerosis. No enlarged abdominal or pelvic lymph nodes. Reproductive: Prostate is unremarkable. Other: No abdominal wall hernia or abnormality. No abdominopelvic ascites. Musculoskeletal: Degenerative changes in the spine. Curvilinear calcifications in the femoral head suggesting avascular necrosis. IMPRESSION: 1. 7 mm stone in the bladder likely representing a recently passed stone. Residual right-sided hydronephrosis and hydroureter suggesting sequela of prior obstruction. Distal ureteral stricture could also be present. 2. Staghorn calcification in the left kidney without change. 3. Distended appendix with diameter measuring 11 mm. Although no periappendiceal stranding or abscess are demonstrated, changes could represent early acute appendicitis. 4. Aortic atherosclerosis. 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 Electronically Signed   By: Elsie Gravely M.D.   On: 03/19/2024 16:13     Procedures   Medications Ordered in the ED  sodium chloride  0.9 % bolus 1,000 mL (1,000 mLs Intravenous New Bag/Given 03/19/24  2221)                                    Medical Decision Making Amount and/or Complexity of Data Reviewed Labs: ordered.    This patient presents to the ED for concern of abnormal labs and CT scan as well as right sided tenderness differential diagnosis includes kidney stone, appendicitis, hydronephrosis, UTI, pyelonephritis    Additional history obtained   Additional history obtained from Electronic Medical Record External records from outside source obtained and reviewed including medical record including CT scan which was performed as an outpatient today   Lab Tests:  I Ordered, and personally interpreted labs.  The pertinent results include: Creatinine had been elevated compared to his baseline at about 2.6 prehospital Urinalysis performed here unlikely to have urine infection, patient is not febrile tachycardic or ill-appearing and has been symptom-free the whole time,   Imaging Studies ordered:  I ordered imaging studies including CT scan of the abdomen and pelvis was ordered earlier today I independently visualized and interpreted imaging which showed residual hydronephrosis and hydroureter on the right with an associated mildly dilated appendix without inflammatory changes I agree with the radiologist interpretation   Medicines ordered and prescription drug management:  I ordered medication including IV fluids for renal insufficiency    I have reviewed the patients home medicines and have made adjustments as needed IV fluids given, patient requesting discharge, his tamsulosin  at home and urology follow-up   Problem List / ED Course:  On my exam the patient has no abdominal tenderness especially on the right lower quadrant, I do not think the CT scan findings correlate with acute appendicitis.  Rather the associated hydronephrosis and hydroureter on the right with a recently passed stone is the likely answer to those findings of his symptoms.  I have warned the  patient that if he develops worsening right lower quadrant pain he would need to come back for evaluation of appendicitis.  Additionally the patient needs a urinalysis to make sure there is no signs of UTI, he will be given antibiotics if it is positive, otherwise IV fluids for the renal insufficiency and he can follow-up outpatient to have this rechecked   Social Determinants of Health:  Frequent kidney stones  Final diagnoses:  Kidney stone on right side    ED Discharge Orders     None          Cleotilde Rogue, MD 03/19/24 2308

## 2024-03-19 NOTE — Progress Notes (Signed)
 Established Patient Office Visit  Subjective:      CC:  Chief Complaint  Patient presents with   Abdominal Pain    HPI: Joseph Vaughn is a 68 y.o. male presenting on 03/19/2024 for Abdominal Pain .  Discussed the use of AI scribe software for clinical note transcription with the patient, who gave verbal consent to proceed.  History of Present Illness Joseph Vaughn is a 68 year old male who presents with abdominal pain and gastrointestinal symptoms.  He began experiencing abdominal pain and gastrointestinal symptoms approximately 7 days ago, characterized by a lack of appetite, significant vomiting, and constipation, which he initially attributed to oxycodone  use. He had no bowel movements for about five days, which is atypical for him, but had a significant bowel movement this morning following a small, pellet-like movement two days ago.  Initially suspecting a kidney stone due to his history, he visited a urologist who ruled out this possibility. The abdominal pain was described as different from previous experiences, with pain and muscle spasms from the umbilical region to the side. 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 He vomited heavily twice but has not vomited in the last two days.  He has been off oxycodone  for a couple of days, which he initially took for pain he thought was related to kidney stones. He also took tamsulosin . He reports feeling better yesterday and today, with reduced nausea and the ability to eat a decent meal yesterday for the first time in five days. No chest pain, breathing issues, or heartburn, noting he takes pantoprazole . He experiences increased gas and some residual abdominal pain, described as pressure rather than sharp pain.  He reports a strange sensation when urinating, described as feeling like 'electricity,' but denies any burning sensation. He has  not had a fever but experiences occasional chills.         Social history:  Relevant past medical, surgical, family and social history reviewed and updated as indicated. Interim medical history since our last visit reviewed.  Allergies and medications reviewed and updated.  DATA REVIEWED: CHART IN EPIC     ROS: Negative unless specifically indicated above in HPI.    Current Outpatient Medications:    Ascorbic Acid (VITAMIN C ) 1000 MG tablet, Take 1 tablet (1,000 mg total) by mouth daily., Disp: , Rfl:    aspirin  EC 81 MG EC tablet, Take 1 tablet (81 mg total) by mouth daily., Disp: 90 tablet, Rfl: 3   buPROPion  (WELLBUTRIN  XL) 150 MG 24 hr tablet, Take 150 mg by mouth in the morning., Disp: , Rfl:    clopidogrel  (PLAVIX ) 75 MG tablet, Take 1 tablet (75 mg total) by mouth daily., Disp: 30 tablet, Rfl: 11   hyoscyamine  (LEVSIN ) 0.125 MG tablet, TAKE 1 TABLET(0.125 MG) BY MOUTH EVERY 4 HOURS AS NEEDED, Disp: 30 tablet, Rfl: 0   lamoTRIgine  (LAMICTAL ) 150 MG tablet, Take 150 mg by mouth in the morning., Disp: , Rfl:    metoprolol  succinate (TOPROL -XL) 25 MG 24 hr tablet, Take 1 tablet (25 mg total) by mouth daily., Disp: 90 tablet, Rfl: 3   nitroGLYCERIN  (NITROSTAT ) 0.4 MG SL tablet, Place 1 tablet (0.4 mg total) under the tongue every 5 (five) minutes as needed for chest pain (CP or SOB)., Disp: 25 tablet, Rfl: 12   ondansetron  (ZOFRAN ) 4 MG tablet, Take 1 tablet (4 mg total) by mouth every 8 (eight) hours  as needed for nausea or vomiting., Disp: 30 tablet, Rfl: 0   oxyCODONE -acetaminophen  (PERCOCET) 5-325 MG tablet, Take 1 tablet by mouth every 4 (four) hours as needed., Disp: 30 tablet, Rfl: 0   pantoprazole  (PROTONIX ) 40 MG tablet, Take 1 tablet (40 mg total) by mouth daily., Disp: 90 tablet, Rfl: 3   rosuvastatin  (CRESTOR ) 40 MG tablet, Take 1 tablet (40 mg total) by mouth daily., Disp: 90 tablet, Rfl: 3   tamsulosin  (FLOMAX ) 0.4 MG CAPS capsule, Take 1 capsule (0.4 mg total) by  mouth daily after supper., Disp: 30 capsule, Rfl: 5        Objective:        BP (!) 152/90 (BP Location: Left Arm, Patient Position: Sitting, Cuff Size: Normal)   Pulse 86   Temp 98.2 F (36.8 C) (Temporal)   Ht 5' 5 (1.651 m)   Wt 163 lb (73.9 kg)   SpO2 99%   BMI 27.12 kg/m   Physical Exam ABDOMEN: Increased bowel sounds, abdominal pressure, and intense abdominal pain.  Wt Readings from Last 3 Encounters:  03/19/24 163 lb (73.9 kg)  10/14/23 166 lb 9.6 oz (75.6 kg)  09/10/22 154 lb (69.9 kg)    Physical Exam Vitals reviewed.  Constitutional:      General: He is not in acute distress.    Appearance: Normal appearance. He is normal weight. He is not ill-appearing, toxic-appearing or diaphoretic.  Cardiovascular:     Rate and Rhythm: Normal rate.  Pulmonary:     Effort: Pulmonary effort is normal.  Abdominal:     Tenderness: There is abdominal tenderness in the right lower quadrant and left lower quadrant. There is no guarding or rebound. Negative signs include Rovsing's sign and psoas sign.  Musculoskeletal:        General: Normal range of motion.  Neurological:     General: No focal deficit present.     Mental Status: He is alert and oriented to person, place, and time. Mental status is at baseline.  Psychiatric:        Mood and Affect: Mood normal.        Behavior: Behavior normal.        Thought Content: Thought content normal.        Judgment: Judgment normal.          Results RADIOLOGY Abdominal X-ray: Nonobstructive bowel gas pattern, no staghorn calculi in the left lower quadrant (03/17/2024)  Assessment & Plan:   Assessment and Plan Assessment & Plan Acute abdominal pain with associated nausea, vomiting, and constipation Acute abdominal pain with associated nausea, vomiting, and constipation began approximately a week ago. Initially suspected kidney stones were ruled out by a urologist. Pain is diffuse with significant muscle spasms from the  umbilicus to the side. Vomiting included bile and possibly blood-tinged material. Constipation for five days, likely exacerbated by oxycodone  use. Differential diagnosis includes bowel impaction, diverticulitis, obstruction, and appendicitis. Recent x-ray showed nonobstructive bowel gas pattern. Symptoms have improved since stopping oxycodone , but residual pain and constipation persist. - Order stat CT of abdomen and pelvis to evaluate for diverticulitis, obstruction, or appendicitis. - Check urine sample for analysis. - Order labs to check liver function and white blood cell count. -ER precautions  Unspecified urinary symptoms Unspecified urinary symptoms described as a strange sensation during urination, likened to electricity. No recent in-person urologist visit, and no urinary symptoms were evaluated by the urologist over the phone. No burning sensation reported during urination. - Check urine sample for  analysis.        Return for f/u PCP if no improvement in symptoms.     Ginger Patrick, MSN, APRN, FNP-C  [facility]

## 2024-03-19 NOTE — ED Triage Notes (Signed)
 Pt arrives via POV from home after having blood work/ CT done today and being told to come to ER due to concern for decreased kidney function levels and blood in urine. Pt denies any pain. Ambulatory to room.

## 2024-03-20 ENCOUNTER — Encounter: Payer: Self-pay | Admitting: Family

## 2024-03-24 ENCOUNTER — Encounter: Payer: Self-pay | Admitting: Family Medicine

## 2024-03-24 ENCOUNTER — Ambulatory Visit: Admitting: Family Medicine

## 2024-03-24 VITALS — BP 146/82 | HR 77 | Temp 97.9°F | Ht 65.0 in | Wt 166.6 lb

## 2024-03-24 DIAGNOSIS — N200 Calculus of kidney: Secondary | ICD-10-CM | POA: Diagnosis not present

## 2024-03-24 DIAGNOSIS — R7989 Other specified abnormal findings of blood chemistry: Secondary | ICD-10-CM | POA: Diagnosis not present

## 2024-03-24 LAB — BASIC METABOLIC PANEL WITH GFR
BUN: 14 mg/dL (ref 6–23)
CO2: 28 meq/L (ref 19–32)
Calcium: 9.7 mg/dL (ref 8.4–10.5)
Chloride: 103 meq/L (ref 96–112)
Creatinine, Ser: 2.12 mg/dL — ABNORMAL HIGH (ref 0.40–1.50)
GFR: 31.57 mL/min — ABNORMAL LOW (ref >60.00)
Glucose, Bld: 106 mg/dL — ABNORMAL HIGH (ref 70–99)
Potassium: 4.8 meq/L (ref 3.5–5.1)
Sodium: 138 meq/L (ref 135–145)

## 2024-03-24 NOTE — Assessment & Plan Note (Signed)
 Recheck pending.  Nsaid cautions d/w pt. He isn't taking aleve or ibuprofen.   He is RV'ing full time. He doesn't have a set permanent location but uses his daughter's address for communication.  Discussed potentially seeing renal clinic locally or elsewhere with recheck Cr pending.  See notes on labs.  Prev Cr trend d/w pt.

## 2024-03-24 NOTE — Patient Instructions (Addendum)
 Go to the lab on the way out.   If you have mychart we'll likely use that to update you.    Take care.  Glad to see you. Don't take aleve or ibuprofen.

## 2024-03-24 NOTE — Assessment & Plan Note (Signed)
 Prev CT d/w pt.  He doesn't recall passing bladder stone.  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  Discussed continuing flomax  and that he may pass this stone.

## 2024-03-24 NOTE — Progress Notes (Signed)
 Elevated Cr. Recheck pending.  Nsaid cautions d/w pt. He isn't taking aleve or ibuprofen.   Prev CT d/w pt.  He doesn't recall passing bladder stone.  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    Off wellbutrin  and lamictal  and mood is good.  No SI/HI.    He is RV'ing full time. He doesn't have a set permanent location but uses his daughter's address for communication.  Discussed potentially seeing renal clinic locally or elsewhere with recheck Cr pending.   Meds, vitals, and allergies reviewed.   ROS: Per HPI unless specifically indicated in ROS section   Nad Ncat Neck supple, no LA Rrr Ctab Abd soft, not ttp Ext w/o edema

## 2024-03-26 ENCOUNTER — Ambulatory Visit: Payer: Self-pay | Admitting: Family Medicine

## 2024-03-26 DIAGNOSIS — R7989 Other specified abnormal findings of blood chemistry: Secondary | ICD-10-CM

## 2024-04-02 ENCOUNTER — Encounter: Payer: Self-pay | Admitting: *Deleted

## 2024-04-21 ENCOUNTER — Other Ambulatory Visit: Payer: Self-pay | Admitting: Cardiovascular Disease

## 2024-04-21 DIAGNOSIS — K219 Gastro-esophageal reflux disease without esophagitis: Secondary | ICD-10-CM

## 2024-05-15 ENCOUNTER — Other Ambulatory Visit: Payer: Self-pay

## 2024-05-20 MED ORDER — ROSUVASTATIN CALCIUM 40 MG PO TABS: 40.0000 mg | ORAL_TABLET | Freq: Every day | ORAL | 1 refills | Status: AC

## 2024-10-15 ENCOUNTER — Ambulatory Visit
